# Patient Record
Sex: Female | Born: 1950 | Race: Black or African American | Hispanic: No | Marital: Married | Smoking: Never smoker
Health system: Southern US, Community
[De-identification: ages and names within clinical notes are randomized; demographics above are authoritative.]

## PROBLEM LIST (undated history)

## (undated) DIAGNOSIS — E119 Type 2 diabetes mellitus without complications: Secondary | ICD-10-CM

## (undated) DIAGNOSIS — I10 Essential (primary) hypertension: Secondary | ICD-10-CM

## (undated) HISTORY — PX: ABDOMINAL HYSTERECTOMY: SHX81

## (undated) HISTORY — PX: BILATERAL OOPHORECTOMY: SHX1221

---

## 2001-07-15 ENCOUNTER — Other Ambulatory Visit: Admission: RE | Admit: 2001-07-15 | Discharge: 2001-07-15 | Payer: Self-pay | Admitting: Obstetrics and Gynecology

## 2001-10-09 ENCOUNTER — Encounter: Payer: Self-pay | Admitting: Cardiology

## 2001-10-09 ENCOUNTER — Ambulatory Visit (HOSPITAL_COMMUNITY): Admission: RE | Admit: 2001-10-09 | Discharge: 2001-10-09 | Payer: Self-pay | Admitting: Cardiology

## 2002-07-27 ENCOUNTER — Ambulatory Visit (HOSPITAL_COMMUNITY): Admission: RE | Admit: 2002-07-27 | Discharge: 2002-07-27 | Payer: Self-pay | Admitting: Family Medicine

## 2004-03-12 ENCOUNTER — Ambulatory Visit (HOSPITAL_COMMUNITY): Admission: RE | Admit: 2004-03-12 | Discharge: 2004-03-12 | Payer: Self-pay | Admitting: Family Medicine

## 2004-03-13 ENCOUNTER — Ambulatory Visit (HOSPITAL_COMMUNITY): Admission: RE | Admit: 2004-03-13 | Discharge: 2004-03-13 | Payer: Self-pay | Admitting: Family Medicine

## 2004-05-04 ENCOUNTER — Ambulatory Visit (HOSPITAL_COMMUNITY): Admission: RE | Admit: 2004-05-04 | Discharge: 2004-05-04 | Payer: Self-pay | Admitting: Family Medicine

## 2004-07-30 ENCOUNTER — Ambulatory Visit (HOSPITAL_COMMUNITY): Admission: RE | Admit: 2004-07-30 | Discharge: 2004-07-30 | Payer: Self-pay | Admitting: Cardiology

## 2004-07-31 ENCOUNTER — Ambulatory Visit: Payer: Self-pay | Admitting: Cardiology

## 2005-08-09 ENCOUNTER — Ambulatory Visit (HOSPITAL_COMMUNITY): Admission: RE | Admit: 2005-08-09 | Discharge: 2005-08-09 | Payer: Self-pay | Admitting: Family Medicine

## 2010-07-11 ENCOUNTER — Encounter: Admission: RE | Admit: 2010-07-11 | Discharge: 2010-07-11 | Payer: Self-pay | Admitting: Family Medicine

## 2012-07-01 ENCOUNTER — Ambulatory Visit
Admission: RE | Admit: 2012-07-01 | Discharge: 2012-07-01 | Disposition: A | Discharge status: Home / Self Care | Payer: Managed Care, Other (non HMO) | Source: Ambulatory Visit | Attending: Family Medicine | Admitting: Family Medicine

## 2012-07-01 ENCOUNTER — Other Ambulatory Visit: Payer: Self-pay | Admitting: Family Medicine

## 2012-07-01 ENCOUNTER — Ambulatory Visit: Payer: Self-pay

## 2012-07-01 DIAGNOSIS — G8929 Other chronic pain: Secondary | ICD-10-CM

## 2013-04-17 ENCOUNTER — Encounter (HOSPITAL_COMMUNITY): Payer: Self-pay | Admitting: Emergency Medicine

## 2013-04-17 ENCOUNTER — Emergency Department (HOSPITAL_COMMUNITY): Payer: BC Managed Care – PPO

## 2013-04-17 ENCOUNTER — Emergency Department (HOSPITAL_COMMUNITY)
Admission: EM | Admit: 2013-04-17 | Discharge: 2013-04-18 | Disposition: A | Discharge status: Home / Self Care | Payer: BC Managed Care – PPO | Attending: Emergency Medicine | Admitting: Emergency Medicine

## 2013-04-17 DIAGNOSIS — M25461 Effusion, right knee: Secondary | ICD-10-CM

## 2013-04-17 DIAGNOSIS — S8000XA Contusion of unspecified knee, initial encounter: Secondary | ICD-10-CM | POA: Insufficient documentation

## 2013-04-17 DIAGNOSIS — W010XXA Fall on same level from slipping, tripping and stumbling without subsequent striking against object, initial encounter: Secondary | ICD-10-CM | POA: Insufficient documentation

## 2013-04-17 DIAGNOSIS — Y9301 Activity, walking, marching and hiking: Secondary | ICD-10-CM | POA: Insufficient documentation

## 2013-04-17 DIAGNOSIS — Y9289 Other specified places as the place of occurrence of the external cause: Secondary | ICD-10-CM | POA: Insufficient documentation

## 2013-04-17 DIAGNOSIS — S8011XA Contusion of right lower leg, initial encounter: Secondary | ICD-10-CM

## 2013-04-17 HISTORY — DX: Essential (primary) hypertension: I10

## 2013-04-17 HISTORY — DX: Type 2 diabetes mellitus without complications: E11.9

## 2013-04-17 MED ORDER — OXYCODONE-ACETAMINOPHEN 5-325 MG PO TABS
1.0000 | ORAL_TABLET | Freq: Once | ORAL | Status: AC
  Administered 2013-04-17: 1 via ORAL
  Filled 2013-04-17: qty 1

## 2013-04-17 NOTE — ED Notes (Signed)
Per pt, fell in garage around 2100. Pt's husband helped pt wrap right knee with ace wrap post fall. Pt presents to the ED with complaint of right thigh, right knee, right calf, left index finger, and left palm pain. Pt states leg is swollen. Upon assessment, pt's is tender to touch on right thigh and knee.

## 2013-04-17 NOTE — ED Notes (Signed)
MD at bedside. 

## 2013-04-17 NOTE — ED Provider Notes (Signed)
CSN: [id number]     Arrival date & time 04/17/13  2258 History  This chart was scribed for Dione Booze, MD, by Yevette Edwards, ED Scribe. This patient was seen in room APA14/[facility]14 and the patient's care was started at 11:08 PM.   First MD Initiated Contact with Patient 04/17/13 2307     Chief Complaint  Patient presents with  . Fall  . Leg Pain    HPI HPI Comments: Madison Arias is a 62 y.o. female who presents to the Emergency Department complaining of a fall which occurred today when she slipped in her garage. She is experiencing pain to her right thigh, her right knee, and her right calf; she is also experiencing swelling to the affected sites. She states that she had difficulty standing after the fall due to the pain to her knee, and she rates the pain as 10/10. She reports that bending the knee increases the pain; she used ace-wrap to help stabilize the pain. The pt reports that she is also experiencing pain to her interscapular back down through her lower back. She denies hitting her head or an LOC. She takes an ASA daily. She denies smoking or using alcohol.   No past medical history on file. No past surgical history on file. No family history on file. History  Substance Use Topics  . Smoking status: Not on file  . Smokeless tobacco: Not on file  . Alcohol Use: Not on file   OB History   No data available     Review of Systems  HENT: Negative for neck pain.   Musculoskeletal: Positive for back pain and arthralgias.  Neurological: Negative for syncope.  All other systems reviewed and are negative.    Allergies  Review of patient's allergies indicates not on file.  Home Medications  No current outpatient prescriptions on file.  Triage Vitals: BP 151/75  Pulse 99  Temp(Src) 99 F (37.2 C) (Oral)  Resp 24  Ht 5\' 8"  (1.727 m)  Wt 205 lb (92.987 kg)  BMI 31.18 kg/m2  SpO2 100%  Physical Exam  Nursing note and vitals reviewed. Constitutional: She is oriented to  person, place, and time. She appears well-developed and well-nourished. No distress.  HENT:  Head: Normocephalic and atraumatic.  Eyes: EOM are normal.  Neck: Neck supple. No tracheal deviation present.  Cardiovascular: Normal rate.   Pulmonary/Chest: Effort normal. No respiratory distress.  Musculoskeletal: Normal range of motion. She exhibits tenderness.  Mild tenderness of thoracic and lumbar spine. No point tenderness. Moderate effusion of right knee with tenderness anterioraly.  Moderate swelling of right calf with tenderness diffusely.  Neuro intact.   Neurological: She is alert and oriented to person, place, and time.  Skin: Skin is warm and dry.  Psychiatric: She has a normal mood and affect. Her behavior is normal.    ED Course  Procedures (including critical care time)   DIAGNOSTIC STUDIES:  Oxygen Saturation is 100% on room air, normal by my interpretation.    COORDINATION OF CARE:  11:15 PM- Discussed treatment plan with patient, and the patient agreed to the plan.   Imaging Review Dg Thoracic Spine W/swimmers  04/18/2013   *RADIOLOGY REPORT*  Clinical Data: Fall with pain.  THORACIC SPINE - 2 VIEW + SWIMMERS  Comparison: None.  Findings: No evidence of acute fracture or subluxation. Diffuse degenerative endplate spurring.  Numerous bilateral calcified pulmonary nodules, compatible with remote granulomatous infection.  No evidence of acute injury to the chest.  IMPRESSION:  No evidence of acute thoracic spine injury.   Original Report Authenticated By: Tiburcio Pea   Dg Lumbar Spine Complete  04/18/2013   CLINICAL DATA:  Fall.  EXAM: LUMBAR SPINE - COMPLETE 4+ VIEW  COMPARISON:  None  FINDINGS: Degenerative spurring throughout the lumbar spine. No fracture. No malalignment. SI joints are symmetric and unremarkable.  IMPRESSION: Degenerative spurring. No acute findings.   Electronically Signed   By: Charlett Nose   On: 04/18/2013 01:55   Dg Femur Right  04/18/2013    *RADIOLOGY REPORT*  Clinical Data: Fall with pain.  RIGHT FEMUR - 2 VIEW  Comparison: None.  Findings: Negative for fracture. Mild hip osteoarthritis with marginal spurring.  Myotendinous junction distal quadriceps heterotopic ossification.  IMPRESSION: Negative for acute osseous injury.   Original Report Authenticated By: Tiburcio Pea   Dg Tibia/fibula Right  04/18/2013   CLINICAL DATA:  Fall, pain.  EXAM: RIGHT TIBIA AND FIBULA - 2 VIEW  COMPARISON:  None  FINDINGS: No acute bony abnormality. Specifically, no fracture, subluxation, or dislocation. Soft tissues are intact.  IMPRESSION: Negative.   Electronically Signed   By: Charlett Nose   On: 04/18/2013 01:57   Ct Cervical Spine Wo Contrast  04/18/2013   CLINICAL DATA:  Fall. Right neck pain.  EXAM: CT CERVICAL SPINE WITHOUT CONTRAST  TECHNIQUE: Multidetector CT imaging of the cervical spine was performed without intravenous contrast. Multiplanar CT image reconstructions were also generated.  COMPARISON:  None.  FINDINGS: Diffuse degenerative disc disease with large anterior and lateral osteophytes. Normal alignment. Prevertebral soft tissues are normal. No fracture. No epidural or paraspinal hematoma.  IMPRESSION: Severe degenerative changes. No acute findings.   Electronically Signed   By: Charlett Nose   On: 04/18/2013 00:29   Dg Knee Complete 4 Views Right  04/18/2013   CLINICAL DATA:  Fall, pain.  EXAM: RIGHT KNEE - COMPLETE 4+ VIEW  COMPARISON:  Femur series performed today. These include the AP and lateral views of the knee.  FINDINGS: No acute bony abnormality. Specifically, no fracture, subluxation, or dislocation. Soft tissues are intact. No joint effusion.  IMPRESSION: No acute bony abnormality.   Electronically Signed   By: Charlett Nose   On: 04/18/2013 01:57   Images viewed by me.   MDM   1. Fall from slipping, initial encounter   2. Contusion of right lower leg, initial encounter   3. Joint effusion, knee, right    Left main  injury to the right knee and right calf. She'll be sent for x-rays including spine, femur, tib-fib. She's given oxycodone-acetaminophen for pain.  X-rays are negative for acute bony injury. She's given a knee immobilizer and prescriptions given for oxycodone and acetaminophen for pain control. She is referred to Dr. Romeo Apple, on call for orthopedics, for followup.   I personally performed the services described in this documentation, which was scribed in my presence. The recorded information has been reviewed and is accurate.     Dione Booze, MD 04/18/13 [phone]

## 2013-04-17 NOTE — ED Notes (Signed)
Patient states she walking into her garage and fell on concrete; states she feels like her right leg went behind her.  Also c/o left hand pain and lower back pain.

## 2013-04-18 MED ORDER — OXYCODONE-ACETAMINOPHEN 5-325 MG PO TABS: 1.0000 | ORAL_TABLET | ORAL | Status: DC | PRN

## 2013-04-18 MED ORDER — OXYCODONE-ACETAMINOPHEN 5-325 MG PO TABS
1.0000 | ORAL_TABLET | Freq: Once | ORAL | Status: AC
  Administered 2013-04-18: 1 via ORAL
  Filled 2013-04-18: qty 1

## 2013-04-21 ENCOUNTER — Telehealth: Payer: Self-pay | Admitting: *Deleted

## 2013-04-21 ENCOUNTER — Encounter: Payer: Self-pay | Admitting: Orthopedic Surgery

## 2013-04-21 ENCOUNTER — Ambulatory Visit (INDEPENDENT_AMBULATORY_CARE_PROVIDER_SITE_OTHER): Payer: BC Managed Care – PPO | Admitting: Orthopedic Surgery

## 2013-04-21 VITALS — BP 173/100 | Ht 68.0 in | Wt 205.0 lb

## 2013-04-21 DIAGNOSIS — S76111A Strain of right quadriceps muscle, fascia and tendon, initial encounter: Secondary | ICD-10-CM

## 2013-04-21 DIAGNOSIS — IMO0002 Reserved for concepts with insufficient information to code with codable children: Secondary | ICD-10-CM

## 2013-04-21 DIAGNOSIS — S838X9A Sprain of other specified parts of unspecified knee, initial encounter: Secondary | ICD-10-CM

## 2013-04-21 DIAGNOSIS — T148XXA Other injury of unspecified body region, initial encounter: Secondary | ICD-10-CM

## 2013-04-21 DIAGNOSIS — S76119A Strain of unspecified quadriceps muscle, fascia and tendon, initial encounter: Secondary | ICD-10-CM | POA: Insufficient documentation

## 2013-04-21 NOTE — Telephone Encounter (Signed)
MRI pre-cert # [id number] valid for 30 days from 04/21/13 per Eunice Blase F MRI scheduled for 04/22/13 at 8 pm, Patient to arrive at 7:45 Patient is aware of appointment date and time, Dr. Romeo Apple to call patient with results.

## 2013-04-21 NOTE — Patient Instructions (Signed)
MRI ORDERED/ DR H NEEDS IT DONE TODAY OOW NOTE 04/17/13 - 04/28/13

## 2013-04-22 ENCOUNTER — Encounter: Payer: Self-pay | Admitting: Orthopedic Surgery

## 2013-04-22 ENCOUNTER — Ambulatory Visit (HOSPITAL_COMMUNITY)
Admission: RE | Admit: 2013-04-22 | Discharge: 2013-04-22 | Disposition: A | Discharge status: Home / Self Care | Payer: BC Managed Care – PPO | Source: Ambulatory Visit | Attending: Orthopedic Surgery | Admitting: Orthopedic Surgery

## 2013-04-22 DIAGNOSIS — M7120 Synovial cyst of popliteal space [Baker], unspecified knee: Secondary | ICD-10-CM | POA: Insufficient documentation

## 2013-04-22 DIAGNOSIS — IMO0002 Reserved for concepts with insufficient information to code with codable children: Secondary | ICD-10-CM | POA: Insufficient documentation

## 2013-04-22 DIAGNOSIS — M2419 Other articular cartilage disorders, other specified site: Secondary | ICD-10-CM | POA: Insufficient documentation

## 2013-04-22 DIAGNOSIS — T148XXA Other injury of unspecified body region, initial encounter: Secondary | ICD-10-CM

## 2013-04-22 DIAGNOSIS — M249 Joint derangement, unspecified: Secondary | ICD-10-CM | POA: Insufficient documentation

## 2013-04-22 DIAGNOSIS — M6740 Ganglion, unspecified site: Secondary | ICD-10-CM | POA: Insufficient documentation

## 2013-04-22 DIAGNOSIS — M1710 Unilateral primary osteoarthritis, unspecified knee: Secondary | ICD-10-CM | POA: Insufficient documentation

## 2013-04-22 DIAGNOSIS — M2240 Chondromalacia patellae, unspecified knee: Secondary | ICD-10-CM | POA: Insufficient documentation

## 2013-04-22 NOTE — Progress Notes (Signed)
Patient ID: Madison Arias, female   DOB: August 06, 1951, 62 y.o.   MRN: [id number]  Chief Complaint  Patient presents with  . Leg Pain    Right leg pain goes all the way down into foot   HISTORY:  62 year old female diabetic with hypertension status post hysterectomy bilateral oophorectomy and D&C fell on Saturday, August 30 at her home going out the door leading to the garage. She presents now with intense pain in her right knee just above the patella with a palpable defect although she has intact extension of the knee. She did not tolerate Percocet. She was placed in a knee immobilizer at the emergency room and she is ambulating with a knee immobilizer crutches weightbearing as tolerated. She is a significant amount of bruising and swelling of the right leg and knee joint with throbbing burning constant pain.  The review of systems was completed and revealed that the patient has had weight gain heart palpitations dizziness and seasonal allergies  Medications include aspirin lovastatin metformin janivia  hydrochlorothiazide and levimiv and she did not bring the medicine so we do not know the dosing  BP 173/100  Ht 5\' 8"  (1.727 m)  Wt 205 lb (92.987 kg)  BMI 31.18 kg/m2  General appearance reveals normal development nutrition body habitus without deformity and good grooming Peripheral vascular system no swelling or varicose veins Pulses and temperature are intact without edema or tenderness  Cervical axillary and inguinal lymph nodes are normal  Gait and station are protected with a brace and crutches  The upper extremities are normally aligned without contracture crepitation subluxation dislocation atrophy tremor or spasticity  The left lower extremity reveals full range of motion normal strength stability alignment.  The right knee reveals 95 of knee flexion the patient has intact straight leg raise but a palpable defect at the superior pole of patella and the tendon is palpable  separated from the patella with large joint effusion. The knee remains stable there is weakness in extension  Her skin is normal x4  We cannot test lower extremity coordination or lower extremity reflexes otherwise reflexes were normal sensation was intact she was oriented x3 she had a normal mood and affect  Imaging Review Dg Thoracic Spine W/swimmers  04/18/2013   *RADIOLOGY REPORT*  Clinical Data: Fall with pain.  THORACIC SPINE - 2 VIEW + SWIMMERS  Comparison: None.  Findings: No evidence of acute fracture or subluxation. Diffuse degenerative endplate spurring.  Numerous bilateral calcified pulmonary nodules, compatible with remote granulomatous infection.  No evidence of acute injury to the chest.  IMPRESSION: No evidence of acute thoracic spine injury.   Original Report Authenticated By: Tiburcio Pea   Dg Lumbar Spine Complete  04/18/2013   CLINICAL DATA:  Fall.  EXAM: LUMBAR SPINE - COMPLETE 4+ VIEW  COMPARISON:  None  FINDINGS: Degenerative spurring throughout the lumbar spine. No fracture. No malalignment. SI joints are symmetric and unremarkable.  IMPRESSION: Degenerative spurring. No acute findings.   Electronically Signed   By: Charlett Nose   On: 04/18/2013 01:55   Dg Femur Right  04/18/2013   *RADIOLOGY REPORT*  Clinical Data: Fall with pain.  RIGHT FEMUR - 2 VIEW  Comparison: None.  Findings: Negative for fracture. Mild hip osteoarthritis with marginal spurring.  Myotendinous junction distal quadriceps heterotopic ossification.  IMPRESSION: Negative for acute osseous injury.   Original Report Authenticated By: Tiburcio Pea   Dg Tibia/fibula Right  04/18/2013   CLINICAL DATA:  Fall, pain.  EXAM: RIGHT  TIBIA AND FIBULA - 2 VIEW  COMPARISON:  None  FINDINGS: No acute bony abnormality. Specifically, no fracture, subluxation, or dislocation. Soft tissues are intact.  IMPRESSION: Negative.   Electronically Signed   By: Charlett Nose   On: 04/18/2013 01:57   Ct Cervical Spine Wo  Contrast  04/18/2013   CLINICAL DATA:  Fall. Right neck pain.  EXAM: CT CERVICAL SPINE WITHOUT CONTRAST  TECHNIQUE: Multidetector CT imaging of the cervical spine was performed without intravenous contrast. Multiplanar CT image reconstructions were also generated.  COMPARISON:  None.  FINDINGS: Diffuse degenerative disc disease with large anterior and lateral osteophytes. Normal alignment. Prevertebral soft tissues are normal. No fracture. No epidural or paraspinal hematoma.  IMPRESSION: Severe degenerative changes. No acute findings.   Electronically Signed   By: Charlett Nose   On: 04/18/2013 00:29   Dg Knee Complete 4 Views Right  04/18/2013   CLINICAL DATA:  Fall, pain.  EXAM: RIGHT KNEE - COMPLETE 4+ VIEW  COMPARISON:  Femur series performed today. These include the AP and lateral views of the knee.  FINDINGS: No acute bony abnormality. Specifically, no fracture, subluxation, or dislocation. Soft tissues are intact. No joint effusion.  IMPRESSION: No acute bony abnormality.   Electronically Signed   By: Charlett Nose   On: 04/18/2013 01:57    The x-rays were read as normal but she does have a low riding patella and a fragment of bone full from the superior pole patella note noted in the soft tissues of the distal thigh consistent with a quadriceps rupture MRI will be performed to evaluate the need for surgical treatment of the ruptured tendon   Encounter Diagnoses  Name Primary?  . Tendon tear Yes  . Quadriceps muscle rupture, right, initial encounter   . Rupture quadriceps tendon, right, initial encounter     MRI right knee to evaluate for surgical repair of the quadriceps tendon

## 2013-04-26 ENCOUNTER — Telehealth: Payer: Self-pay | Admitting: Orthopedic Surgery

## 2013-04-26 MED FILL — Oxycodone w/ Acetaminophen Tab 5-325 MG: ORAL | Qty: 6 | Status: AC

## 2013-04-26 NOTE — Telephone Encounter (Signed)
Routing to Dr Harrison 

## 2013-04-26 NOTE — Telephone Encounter (Signed)
Call her tell her she has Tear quadriceps tendon  Will need surgery and i will call her to discuss the surgery  later in the day

## 2013-04-26 NOTE — Telephone Encounter (Signed)
Patient called to ask about MRI results of right knee which she had on Friday, 9/4/104 and plan.  Her cell ph # is [phone].

## 2013-04-26 NOTE — Telephone Encounter (Signed)
Message relayed to patient. She will be awaiting Dr. Mort Sawyers call.

## 2013-04-27 ENCOUNTER — Other Ambulatory Visit: Payer: Self-pay | Admitting: Orthopedic Surgery

## 2013-04-27 ENCOUNTER — Telehealth: Payer: Self-pay | Admitting: Orthopedic Surgery

## 2013-04-27 ENCOUNTER — Encounter: Payer: Self-pay | Admitting: Orthopedic Surgery

## 2013-04-27 NOTE — Telephone Encounter (Signed)
Patient called back, states awaiting further information and a call back from Dr. Romeo Apple regarding plan of treatment / surgery.  Best call back # is cell#[phone].

## 2013-04-27 NOTE — Telephone Encounter (Signed)
Called patient to discuss surgery.

## 2013-04-27 NOTE — Telephone Encounter (Signed)
Dr. Romeo Apple called patient regarding surgery. The surgery was scheduled and all dates and times were given to patient.

## 2013-04-28 ENCOUNTER — Telehealth: Payer: Self-pay | Admitting: Orthopedic Surgery

## 2013-04-28 ENCOUNTER — Encounter (HOSPITAL_COMMUNITY): Payer: Self-pay | Admitting: Pharmacy Technician

## 2013-04-28 ENCOUNTER — Encounter: Payer: Self-pay | Admitting: Orthopedic Surgery

## 2013-04-28 NOTE — Telephone Encounter (Signed)
Regarding surgery scheduled 05/03/13 at [facility], CPT [phone], ICD9 codes 848.9, 843.9, 844.8, contacted insurer [employer], ph# [phone]; per Leota Sauers, no pre-authorization required for out-patient or for up to 48 hours observation stay. Her name and today's date for reference, 04/28/13, 2:22 p.m.

## 2013-04-29 ENCOUNTER — Encounter (HOSPITAL_COMMUNITY): Payer: Self-pay | Admitting: Pharmacy Technician

## 2013-04-29 ENCOUNTER — Encounter (HOSPITAL_COMMUNITY)
Admission: RE | Admit: 2013-04-29 | Discharge: 2013-04-29 | Disposition: A | Discharge status: Home / Self Care | Payer: BC Managed Care – PPO | Source: Ambulatory Visit | Attending: Orthopedic Surgery | Admitting: Orthopedic Surgery

## 2013-04-29 ENCOUNTER — Other Ambulatory Visit: Payer: Self-pay

## 2013-04-29 ENCOUNTER — Encounter (HOSPITAL_COMMUNITY): Payer: Self-pay

## 2013-04-29 DIAGNOSIS — Z01810 Encounter for preprocedural cardiovascular examination: Secondary | ICD-10-CM | POA: Insufficient documentation

## 2013-04-29 DIAGNOSIS — Z01818 Encounter for other preprocedural examination: Secondary | ICD-10-CM | POA: Insufficient documentation

## 2013-04-29 DIAGNOSIS — Z01812 Encounter for preprocedural laboratory examination: Secondary | ICD-10-CM | POA: Insufficient documentation

## 2013-04-29 LAB — CBC
Hemoglobin: 12.4 g/dL (ref 12.0–15.0)
MCH: 28.8 pg (ref 26.0–34.0)
RBC: 4.31 MIL/uL (ref 3.87–5.11)

## 2013-04-29 LAB — BASIC METABOLIC PANEL
CO2: 30 mEq/L (ref 19–32)
Chloride: 104 mEq/L (ref 96–112)
Glucose, Bld: 126 mg/dL — ABNORMAL HIGH (ref 70–99)
Potassium: 4 mEq/L (ref 3.5–5.1)
Sodium: 143 mEq/L (ref 135–145)

## 2013-04-29 NOTE — Patient Instructions (Addendum)
   Your procedure is scheduled on: 05/03/2013  Report to [facility] at   8:30  AM.  Call this number if you have problems the morning of surgery: [phone]   Remember:   Do not drink or eat food:After Midnight.  :  Take these medicines the morning of surgery with A SIP OF WATER: none   Do not wear jewelry, make-up or nail polish.  Do not wear lotions, powders, or perfumes. You may wear deodorant.  Do not shave 48 hours prior to surgery. Men may shave face and neck.  Do not bring valuables to the hospital.  Contacts, dentures or bridgework may not be worn into surgery.  Leave suitcase in the car. After surgery it may be brought to your room.  For patients admitted to the hospital, checkout time is 11:00 AM the day of discharge.   Patients discharged the day of surgery will not be allowed to drive home.    Special Instructions: Shower using CHG 2 nights before surgery and the night before surgery.  If you shower the day of surgery use CHG.  Use special wash - you have one bottle of CHG for all showers.  You should use approximately 1/3 of the bottle for each shower.   Please read over the following fact sheets that you were given: Pain Booklet, MRSA Information, Surgical Site Infection Prevention and Care and Recovery After Surgery  PATIENT INSTRUCTIONS POST-ANESTHESIA  IMMEDIATELY FOLLOWING SURGERY:  Do not drive or operate machinery for the first twenty four hours after surgery.  Do not make any important decisions for twenty four hours after surgery or while taking narcotic pain medications or sedatives.  If you develop intractable nausea and vomiting or a severe headache please notify your doctor immediately.  FOLLOW-UP:  Please make an appointment with your surgeon as instructed. You do not need to follow up with anesthesia unless specifically instructed to do so.  WOUND CARE INSTRUCTIONS (if applicable):  Keep a dry clean dressing on the anesthesia/puncture wound site if there is  drainage.  Once the wound has quit draining you may leave it open to air.  Generally you should leave the bandage intact for twenty four hours unless there is drainage.  If the epidural site drains for more than 36-48 hours please call the anesthesia department.  QUESTIONS?:  Please feel free to call your physician or the hospital operator if you have any questions, and they will be happy to assist you.

## 2013-04-30 NOTE — H&P (Signed)
Chief Complaint   Patient presents with   .  Leg Pain       Right leg pain goes all the way down into foot    HISTORY:  62 year old female diabetic with hypertension status post hysterectomy bilateral oophorectomy and D&C fell on Saturday, August 30 at her home going out the door leading to the garage. She presents now with intense pain in her right knee just above the patella with a palpable defect although she has intact extension of the knee. She did not tolerate Percocet. She was placed in a knee immobilizer at the emergency room and she is ambulating with a knee immobilizer crutches weightbearing as tolerated. She is a significant amount of bruising and swelling of the right leg and knee joint with throbbing burning constant pain.  Past Medical History  Diagnosis Date  . Hypertension   . Diabetes mellitus without complication     Past Surgical History  Procedure Laterality Date  . Bilateral oophorectomy    . Abdominal hysterectomy      Family History  Problem Relation Age of Onset  . Diabetes     History  Substance Use Topics  . Smoking status: Never Smoker   . Smokeless tobacco: Not on file  . Alcohol Use: No     The review of systems was completed and revealed that the patient has had weight gain heart palpitations dizziness and seasonal allergies  Medications include aspirin lovastatin metformin janivia  hydrochlorothiazide and levimiv and she did not bring the medicine so we do not know the dosing  BP 173/100  Ht 5\' 8"  (1.727 m)  Wt 205 lb (92.987 kg)  BMI 31.18 kg/m2  General appearance reveals normal development nutrition body habitus without deformity and good grooming Peripheral vascular system no swelling or varicose veins Pulses and temperature are intact without edema or tenderness  Cervical axillary and inguinal lymph nodes are normal  Gait and station are protected with a brace and crutches  The upper extremities are normally aligned without  contracture crepitation subluxation dislocation atrophy tremor or spasticity  The left lower extremity reveals full range of motion normal strength stability alignment.  The right knee reveals 95 of knee flexion the patient has intact straight leg raise but a palpable defect at the superior pole of patella and the tendon is palpable separated from the patella with large joint effusion. The knee remains stable there is weakness in extension  Her skin is normal x4  We cannot test lower extremity coordination or lower extremity reflexes otherwise reflexes were normal sensation was intact she was oriented x3 she had a normal mood and affect 04/18/2013   CLINICAL DATA:  Fall. Right neck pain.  EXAM: CT CERVICAL SPINE WITHOUT CONTRAST  TECHNIQUE: Multidetector CT imaging of the cervical spine was performed without intravenous contrast. Multiplanar CT image reconstructions were also generated.  COMPARISON:  None.  FINDINGS: Diffuse degenerative disc disease with large anterior and lateral osteophytes. Normal alignment. Prevertebral soft tissues are normal. No fracture. No epidural or paraspinal hematoma.  IMPRESSION: Severe degenerative changes. No acute findings.   Electronically Signed   By: Charlett Nose   On: 04/18/2013 00:29   Dg Knee Complete 4 Views Right  04/18/2013   CLINICAL DATA:  Fall, pain.  EXAM: RIGHT KNEE - COMPLETE 4+ VIEW  COMPARISON:  Femur series performed today. These include the AP and lateral views of the knee.  FINDINGS: No acute bony abnormality. Specifically, no fracture, subluxation, or dislocation. Soft tissues  are intact. No joint effusion.  IMPRESSION: No acute bony abnormality.   Electronically Signed   By: Charlett Nose   On: 04/18/2013 01:57    The x-rays were read as normal but she does have a low riding patella and a fragment of bone full from the superior pole patella note noted in the soft tissues of the distal thigh consistent with a quadriceps rupture MRI will be performed  to evaluate the need for surgical treatment of the ruptured tendon     Encounter Diagnoses   Name  Primary?   .  Tendon tear  Yes   .  Quadriceps muscle rupture, right, initial encounter     .  Rupture quadriceps tendon, right, initial encounter      MRI findings IMPRESSION: 1.  Incomplete rupture of the distal quadriceps tendon with 15 mm retraction.  The tear penetrates the superficial three-quarters of the tendon with a small portion of the deep tendon intact. 2.  Fraying of the menisci without tear. 3.  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 4.  Proximal tibiofibular joint osteoarthritis with ganglion extending anteriorly to the joint and inferiorly which could encroach on the common peroneal nerve.  Quadriceps tendon rupture right knee  Plan for surgical repair quadriceps tendon right knee

## 2013-05-03 ENCOUNTER — Ambulatory Visit (HOSPITAL_COMMUNITY): Payer: BC Managed Care – PPO | Admitting: Anesthesiology

## 2013-05-03 ENCOUNTER — Encounter (HOSPITAL_COMMUNITY): Payer: Self-pay | Admitting: *Deleted

## 2013-05-03 ENCOUNTER — Encounter (HOSPITAL_COMMUNITY): Payer: Self-pay | Admitting: Anesthesiology

## 2013-05-03 ENCOUNTER — Encounter (HOSPITAL_COMMUNITY)
Admission: RE | Disposition: A | Discharge status: Home / Self Care | Payer: Self-pay | Source: Ambulatory Visit | Attending: Orthopedic Surgery

## 2013-05-03 ENCOUNTER — Ambulatory Visit (HOSPITAL_COMMUNITY)
Admission: RE | Admit: 2013-05-03 | Discharge: 2013-05-03 | Disposition: A | Discharge status: Home / Self Care | Payer: BC Managed Care – PPO | Source: Ambulatory Visit | Attending: Orthopedic Surgery | Admitting: Orthopedic Surgery

## 2013-05-03 DIAGNOSIS — Z5189 Encounter for other specified aftercare: Secondary | ICD-10-CM

## 2013-05-03 DIAGNOSIS — S838X9A Sprain of other specified parts of unspecified knee, initial encounter: Secondary | ICD-10-CM | POA: Insufficient documentation

## 2013-05-03 DIAGNOSIS — IMO0002 Reserved for concepts with insufficient information to code with codable children: Secondary | ICD-10-CM

## 2013-05-03 DIAGNOSIS — Z01812 Encounter for preprocedural laboratory examination: Secondary | ICD-10-CM | POA: Insufficient documentation

## 2013-05-03 DIAGNOSIS — S76111D Strain of right quadriceps muscle, fascia and tendon, subsequent encounter: Secondary | ICD-10-CM

## 2013-05-03 DIAGNOSIS — E119 Type 2 diabetes mellitus without complications: Secondary | ICD-10-CM | POA: Insufficient documentation

## 2013-05-03 DIAGNOSIS — Y92009 Unspecified place in unspecified non-institutional (private) residence as the place of occurrence of the external cause: Secondary | ICD-10-CM | POA: Insufficient documentation

## 2013-05-03 DIAGNOSIS — W19XXXA Unspecified fall, initial encounter: Secondary | ICD-10-CM | POA: Insufficient documentation

## 2013-05-03 HISTORY — PX: QUADRICEPS TENDON REPAIR: SHX756

## 2013-05-03 LAB — GLUCOSE, CAPILLARY: Glucose-Capillary: 100 mg/dL — ABNORMAL HIGH (ref 70–99)

## 2013-05-03 SURGERY — REPAIR, TENDON, QUADRICEPS
Anesthesia: General | Site: Knee | Laterality: Right | Wound class: Clean

## 2013-05-03 MED ORDER — MIDAZOLAM HCL 2 MG/2ML IJ SOLN
INTRAMUSCULAR | Status: AC
  Filled 2013-05-03: qty 2

## 2013-05-03 MED ORDER — LACTATED RINGERS IV SOLN
INTRAVENOUS | Status: DC | PRN
  Administered 2013-05-03 (×2): via INTRAVENOUS

## 2013-05-03 MED ORDER — VANCOMYCIN HCL IN DEXTROSE 1-5 GM/200ML-% IV SOLN
1000.0000 mg | INTRAVENOUS | Status: AC
  Administered 2013-05-03: 1000 mg via INTRAVENOUS

## 2013-05-03 MED ORDER — FENTANYL CITRATE 0.05 MG/ML IJ SOLN
INTRAMUSCULAR | Status: AC
  Filled 2013-05-03: qty 2

## 2013-05-03 MED ORDER — BUPIVACAINE-EPINEPHRINE PF 0.5-1:200000 % IJ SOLN
INTRAMUSCULAR | Status: AC
  Filled 2013-05-03: qty 20

## 2013-05-03 MED ORDER — GLYCOPYRROLATE 0.2 MG/ML IJ SOLN
INTRAMUSCULAR | Status: AC
  Filled 2013-05-03: qty 1

## 2013-05-03 MED ORDER — BUPIVACAINE-EPINEPHRINE 0.5% -1:200000 IJ SOLN
INTRAMUSCULAR | Status: DC | PRN
  Administered 2013-05-03: 60 mL

## 2013-05-03 MED ORDER — MIDAZOLAM HCL 5 MG/5ML IJ SOLN
INTRAMUSCULAR | Status: DC | PRN
  Administered 2013-05-03: 2 mg via INTRAVENOUS

## 2013-05-03 MED ORDER — ONDANSETRON HCL 4 MG/2ML IJ SOLN
INTRAMUSCULAR | Status: AC
  Filled 2013-05-03: qty 2

## 2013-05-03 MED ORDER — CHLORHEXIDINE GLUCONATE 4 % EX LIQD: 60.0000 mL | Freq: Once | CUTANEOUS | Status: DC

## 2013-05-03 MED ORDER — LIDOCAINE HCL (PF) 1 % IJ SOLN
INTRAMUSCULAR | Status: AC
  Filled 2013-05-03: qty 5

## 2013-05-03 MED ORDER — ONDANSETRON HCL 4 MG/2ML IJ SOLN
4.0000 mg | Freq: Once | INTRAMUSCULAR | Status: AC
  Administered 2013-05-03: 4 mg via INTRAVENOUS

## 2013-05-03 MED ORDER — ONDANSETRON HCL 4 MG/2ML IJ SOLN: 4.0000 mg | Freq: Once | INTRAMUSCULAR | Status: DC | PRN

## 2013-05-03 MED ORDER — KETOROLAC TROMETHAMINE 30 MG/ML IJ SOLN
INTRAMUSCULAR | Status: AC
  Filled 2013-05-03: qty 1

## 2013-05-03 MED ORDER — FENTANYL CITRATE 0.05 MG/ML IJ SOLN
25.0000 ug | INTRAMUSCULAR | Status: DC | PRN
  Administered 2013-05-03: 50 ug via INTRAVENOUS
  Administered 2013-05-03: 25 ug via INTRAVENOUS
  Administered 2013-05-03: 50 ug via INTRAVENOUS
  Administered 2013-05-03: 25 ug via INTRAVENOUS
  Administered 2013-05-03: 50 ug via INTRAVENOUS

## 2013-05-03 MED ORDER — GLYCOPYRROLATE 0.2 MG/ML IJ SOLN
0.2000 mg | Freq: Once | INTRAMUSCULAR | Status: AC
  Administered 2013-05-03: 0.2 mg via INTRAVENOUS

## 2013-05-03 MED ORDER — KETOROLAC TROMETHAMINE 30 MG/ML IJ SOLN
30.0000 mg | Freq: Once | INTRAMUSCULAR | Status: AC
  Administered 2013-05-03: 30 mg via INTRAVENOUS

## 2013-05-03 MED ORDER — ROCURONIUM BROMIDE 100 MG/10ML IV SOLN
INTRAVENOUS | Status: DC | PRN
  Administered 2013-05-03: 10 mg via INTRAVENOUS

## 2013-05-03 MED ORDER — MIDAZOLAM HCL 2 MG/2ML IJ SOLN
1.0000 mg | INTRAMUSCULAR | Status: DC | PRN
  Administered 2013-05-03 (×2): 2 mg via INTRAVENOUS

## 2013-05-03 MED ORDER — OXYCODONE HCL 5 MG PO TABS
ORAL_TABLET | ORAL | Status: AC
  Filled 2013-05-03: qty 1

## 2013-05-03 MED ORDER — FENTANYL CITRATE 0.05 MG/ML IJ SOLN
INTRAMUSCULAR | Status: AC
  Filled 2013-05-03: qty 5

## 2013-05-03 MED ORDER — FENTANYL CITRATE 0.05 MG/ML IJ SOLN
50.0000 ug | INTRAMUSCULAR | Status: AC | PRN
  Administered 2013-05-03 (×2): 50 ug via INTRAVENOUS

## 2013-05-03 MED ORDER — LACTATED RINGERS IV SOLN
INTRAVENOUS | Status: DC
  Administered 2013-05-03: 1000 mL via INTRAVENOUS

## 2013-05-03 MED ORDER — SODIUM CHLORIDE 0.9 % IR SOLN
Status: DC | PRN
  Administered 2013-05-03: 1000 mL

## 2013-05-03 MED ORDER — ROCURONIUM BROMIDE 50 MG/5ML IV SOLN
INTRAVENOUS | Status: AC
  Filled 2013-05-03: qty 1

## 2013-05-03 MED ORDER — PROPOFOL 10 MG/ML IV EMUL
INTRAVENOUS | Status: AC
  Filled 2013-05-03: qty 20

## 2013-05-03 MED ORDER — SUCCINYLCHOLINE CHLORIDE 20 MG/ML IJ SOLN
INTRAMUSCULAR | Status: DC | PRN
  Administered 2013-05-03: 120 mg via INTRAVENOUS

## 2013-05-03 MED ORDER — DEXAMETHASONE SODIUM PHOSPHATE 4 MG/ML IJ SOLN
4.0000 mg | Freq: Once | INTRAMUSCULAR | Status: AC
  Administered 2013-05-03: 4 mg via INTRAVENOUS

## 2013-05-03 MED ORDER — DEXAMETHASONE SODIUM PHOSPHATE 4 MG/ML IJ SOLN
INTRAMUSCULAR | Status: AC
  Filled 2013-05-03: qty 1

## 2013-05-03 MED ORDER — OXYCODONE HCL 5 MG PO TABS
5.0000 mg | ORAL_TABLET | ORAL | Status: DC
  Administered 2013-05-03: 5 mg via ORAL

## 2013-05-03 MED ORDER — ARTIFICIAL TEARS OP OINT
TOPICAL_OINTMENT | OPHTHALMIC | Status: AC
  Filled 2013-05-03: qty 3.5

## 2013-05-03 MED ORDER — PROPOFOL 10 MG/ML IV BOLUS
INTRAVENOUS | Status: DC | PRN
  Administered 2013-05-03: 150 mg via INTRAVENOUS

## 2013-05-03 MED ORDER — OXYCODONE HCL 5 MG PO TABS
5.0000 mg | ORAL_TABLET | Freq: Once | ORAL | Status: AC
  Administered 2013-05-03: 5 mg via ORAL

## 2013-05-03 MED ORDER — LIDOCAINE HCL (CARDIAC) 20 MG/ML IV SOLN
INTRAVENOUS | Status: DC | PRN
  Administered 2013-05-03: 50 mg via INTRAVENOUS

## 2013-05-03 MED ORDER — OXYCODONE-ACETAMINOPHEN 5-325 MG PO TABS: 1.0000 | ORAL_TABLET | ORAL | Status: AC | PRN

## 2013-05-03 MED ORDER — FENTANYL CITRATE 0.05 MG/ML IJ SOLN
INTRAMUSCULAR | Status: DC | PRN
  Administered 2013-05-03: 50 ug via INTRAVENOUS
  Administered 2013-05-03: 100 ug via INTRAVENOUS
  Administered 2013-05-03 (×2): 50 ug via INTRAVENOUS
  Administered 2013-05-03: 100 ug via INTRAVENOUS
  Administered 2013-05-03 (×2): 50 ug via INTRAVENOUS

## 2013-05-03 MED ORDER — VANCOMYCIN HCL IN DEXTROSE 1-5 GM/200ML-% IV SOLN
INTRAVENOUS | Status: AC
  Filled 2013-05-03: qty 200

## 2013-05-03 SURGICAL SUPPLY — 48 items
BAG HAMPER (MISCELLANEOUS) ×2 IMPLANT
BANDAGE ESMARK 4X12 BL STRL LF (DISPOSABLE) ×1 IMPLANT
BIT DRILL 2.8X128 (BIT) ×2 IMPLANT
BLADE SURG SZ10 CARB STEEL (BLADE) ×2 IMPLANT
BNDG CMPR 12X4 ELC STRL LF (DISPOSABLE) ×1
BNDG COHESIVE 4X5 TAN NS LF (GAUZE/BANDAGES/DRESSINGS) ×2 IMPLANT
BNDG ESMARK 4X12 BLUE STRL LF (DISPOSABLE) ×2
BRACE T-SCOPE KNEE POSTOP (MISCELLANEOUS) ×1 IMPLANT
CHLORAPREP W/TINT 26ML (MISCELLANEOUS) ×2 IMPLANT
CLOTH BEACON ORANGE TIMEOUT ST (SAFETY) ×2 IMPLANT
COVER LIGHT HANDLE STERIS (MISCELLANEOUS) ×4 IMPLANT
CUFF TOURNIQUET SINGLE 24IN (TOURNIQUET CUFF) IMPLANT
CUFF TOURNIQUET SINGLE 34IN LL (TOURNIQUET CUFF) ×2 IMPLANT
DRSG MEPILEX BORDER 4X12 (GAUZE/BANDAGES/DRESSINGS) ×2 IMPLANT
GAUZE XEROFORM 5X9 LF (GAUZE/BANDAGES/DRESSINGS) IMPLANT
GLOVE ECLIPSE 6.5 STRL STRAW (GLOVE) ×1 IMPLANT
GLOVE EXAM NITRILE PF MED BLUE (GLOVE) ×1 IMPLANT
GLOVE INDICATOR 7.0 STRL GRN (GLOVE) ×1 IMPLANT
GLOVE SKINSENSE NS SZ8.0 LF (GLOVE) ×1
GLOVE SKINSENSE STRL SZ8.0 LF (GLOVE) ×1 IMPLANT
GLOVE SS BIOGEL STRL SZ 6.5 (GLOVE) IMPLANT
GLOVE SS N UNI LF 8.5 STRL (GLOVE) ×2 IMPLANT
GLOVE SUPERSENSE BIOGEL SZ 6.5 (GLOVE) ×1
GOWN STRL REIN XL XLG (GOWN DISPOSABLE) ×6 IMPLANT
IMMOBILIZER KNEE 19 UNV (ORTHOPEDIC SUPPLIES) ×1 IMPLANT
INST SET MAJOR BONE (KITS) ×2 IMPLANT
KIT ROOM TURNOVER APOR (KITS) ×2 IMPLANT
MANIFOLD NEPTUNE II (INSTRUMENTS) ×2 IMPLANT
NDL HYPO 21X1.5 SAFETY (NEEDLE) ×1 IMPLANT
NDL MAYO 6 CRC TAPER PT (NEEDLE) IMPLANT
NEEDLE HYPO 21X1.5 SAFETY (NEEDLE) ×2 IMPLANT
NEEDLE MAYO 6 CRC TAPER PT (NEEDLE) IMPLANT
NS IRRIG 1000ML POUR BTL (IV SOLUTION) ×2 IMPLANT
PACK BASIC LIMB (CUSTOM PROCEDURE TRAY) ×2 IMPLANT
PAD ARMBOARD 7.5X6 YLW CONV (MISCELLANEOUS) ×2 IMPLANT
PASSER SUT SWANSON 36MM LOOP (INSTRUMENTS) ×2 IMPLANT
SET BASIN LINEN APH (SET/KITS/TRAYS/PACK) ×2 IMPLANT
SPONGE LAP 18X18 X RAY DECT (DISPOSABLE) ×4 IMPLANT
STAPLER VISISTAT 35W (STAPLE) ×2 IMPLANT
SUT BRALON NAB BRD #1 30IN (SUTURE) ×4 IMPLANT
SUT ETHIBOND 5 LR DA (SUTURE) ×3 IMPLANT
SUT ETHIBOND NAB OS 4 #2 30IN (SUTURE) IMPLANT
SUT ETHILON 3 0 FSL (SUTURE) IMPLANT
SUT MON AB 0 CT1 (SUTURE) ×2 IMPLANT
SUT MON AB 2-0 CT1 36 (SUTURE) ×3 IMPLANT
SUT PROLENE 3 0 PS 1 (SUTURE) IMPLANT
SYR 30ML LL (SYRINGE) ×2 IMPLANT
SYR BULB IRRIGATION 50ML (SYRINGE) ×2 IMPLANT

## 2013-05-03 NOTE — Anesthesia Preprocedure Evaluation (Signed)
Anesthesia Evaluation  Patient identified by MRN, date of birth, ID band Patient awake    Reviewed: Allergy & Precautions, H&P , NPO status , Patient's Chart, lab work & pertinent test results  Airway Mallampati: III TM Distance: <3 FB Neck ROM: Full    Dental  (+) Teeth Intact   Pulmonary neg pulmonary ROS,  breath sounds clear to auscultation        Cardiovascular hypertension, Pt. on medications Rhythm:Regular Rate:Normal     Neuro/Psych    GI/Hepatic negative GI ROS,   Endo/Other  diabetes, Type 2, Oral Hypoglycemic Agents  Renal/GU      Musculoskeletal   Abdominal   Peds  Hematology   Anesthesia Other Findings   Reproductive/Obstetrics                           Anesthesia Physical Anesthesia Plan  ASA: III  Anesthesia Plan: General   Post-op Pain Management:    Induction: Intravenous  Airway Management Planned: Oral ETT and Video Laryngoscope Planned  Additional Equipment:   Intra-op Plan:   Post-operative Plan: Extubation in OR  Informed Consent: I have reviewed the patients History and Physical, chart, labs and discussed the procedure including the risks, benefits and alternatives for the proposed anesthesia with the patient or authorized representative who has indicated his/her understanding and acceptance.     Plan Discussed with:   Anesthesia Plan Comments:         Anesthesia Quick Evaluation

## 2013-05-03 NOTE — Anesthesia Procedure Notes (Signed)
Procedure Name: Intubation Date/Time: 05/03/2013 10:17 AM Performed by: Carolyne Littles, Lakeva Hollon L Pre-anesthesia Checklist: Patient identified, Patient being monitored, Timeout performed, Emergency Drugs available and Suction available Patient Re-evaluated:Patient Re-evaluated prior to inductionOxygen Delivery Method: Circle System Utilized Preoxygenation: Pre-oxygenation with 100% oxygen Intubation Type: IV induction Ventilation: Mask ventilation without difficulty Laryngoscope Size: 3 and Miller Grade View: Grade I Tube type: Oral Tube size: 7.0 mm Number of attempts: 1 Airway Equipment and Method: stylet Placement Confirmation: ETT inserted through vocal cords under direct vision,  positive ETCO2 and breath sounds checked- equal and bilateral Secured at: 21 cm Tube secured with: Tape Dental Injury: Teeth and Oropharynx as per pre-operative assessment

## 2013-05-03 NOTE — Preoperative (Signed)
Beta Blockers   Reason not to administer Beta Blockers:Not Applicable 

## 2013-05-03 NOTE — Transfer of Care (Signed)
Immediate Anesthesia Transfer of Care Note  Patient: Madison Arias  Procedure(s) Performed: Procedure(s): REPAIR QUADRICEP TENDON (Right)  Patient Location: PACU  Anesthesia Type:General  Level of Consciousness: awake, oriented and patient cooperative  Airway & Oxygen Therapy: Patient Spontanous Breathing and Patient connected to face mask oxygen  Post-op Assessment: Report given to PACU RN and Post -op Vital signs reviewed and stable  Post vital signs: Reviewed and stable  Complications: No apparent anesthesia complications

## 2013-05-03 NOTE — Interval H&P Note (Signed)
History and Physical Interval Note:  05/03/2013 10:05 AM  Madison Arias  has presented today for surgery, with the diagnosis of quadriceps tear right knee  The various methods of treatment have been discussed with the patient and family. After consideration of risks, benefits and other options for treatment, the patient has consented to  Procedure(s) with comments: REPAIR QUADRICEP TENDON (Right) - 10 am if possible  as a surgical intervention .  The patient's history has been reviewed, patient examined, no change in status, stable for surgery.  I have reviewed the patient's chart and labs.  Questions were answered to the patient's satisfaction.     Fuller Canada

## 2013-05-03 NOTE — Anesthesia Postprocedure Evaluation (Signed)
  Anesthesia Post-op Note  Patient: Madison Arias  Procedure(s) Performed: Procedure(s): REPAIR QUADRICEP TENDON (Right)  Patient Location: PACU  Anesthesia Type:General  Level of Consciousness: awake, oriented and patient cooperative  Airway and Oxygen Therapy: Patient Spontanous Breathing and Patient connected to face mask oxygen  Post-op Pain: mild  Post-op Assessment: Post-op Vital signs reviewed, Patient's Cardiovascular Status Stable, Respiratory Function Stable, Patent Airway, No signs of Nausea or vomiting and Pain level controlled  Post-op Vital Signs: Reviewed and stable  Complications: No apparent anesthesia complications

## 2013-05-03 NOTE — Brief Op Note (Signed)
05/03/2013  11:27 AM  PATIENT:  Madison Arias  62 y.o. female  PRE-OPERATIVE DIAGNOSIS:  quadriceps tear right knee  POST-OPERATIVE DIAGNOSIS:  quadriceps tear right knee  Operative findings partial rupture right quadriceps including the anterior three quarters of the tendon.   PROCEDURE:  Procedure(s): REPAIR QUADRICEP TENDON (Right)  SURGEON:  Surgeon(s) and Role:    * Vickki Hearing, MD - Primary  PHYSICIAN ASSISTANT:   ASSISTANTS:  [place]   ANESTHESIA:   general  EBL:  Total I/O In: 1000 [I.V.:1000] Out: 50 [Blood:50]  BLOOD ADMINISTERED:none  DRAINS: none   LOCAL MEDICATIONS USED:  OTHER Marcaine with epinephrine 60 cc 30 cc in the joint and 30 cc in the subcutaneous tissue  SPECIMEN:  No Specimen  DISPOSITION OF SPECIMEN:  N/A  COUNTS:  YES  TOURNIQUET:   no tourniquet  DICTATION: .Dragon Dictation  PLAN OF CARE: Discharge to home after PACU  PATIENT DISPOSITION:  PACU - hemodynamically stable.   Delay start of Pharmacological VTE agent (>24hrs) due to surgical blood loss or risk of bleeding: Not applicable

## 2013-05-03 NOTE — Op Note (Signed)
Operative report preoperative diagnosis was ruptured quadriceps tendon right knee  Brief op note recorded in brief op note section  In the preop holding area the right knee was marked as a surgical site after confirmation. The chart was reviewed and updated.  The patient was taken to the operating room for general anesthesia. After successful general anesthesia the right leg was prepped from the groin through and including the foot. Sterile draping was performed and timeout was completed.  A midline incision was made. This was taken down to the extensor mechanism. The tear was identified hematoma was evacuated. The wound was irrigated including the joint. The torn portions of the distal quadriceps was excised. The patella was roughened and debrided. 3 drill holes were placed and the patella . 2 #5 sutures were placed in a Krackow suturing technique and then passed through the drill holes in the patella and tied on the distal portion of the patella with the leg in maximum extension.  The knee was flexed and the suture line had tension at 40. This will allow 0-30 range of motion and a hinged brace starting on postop day 2 or once she goes to the office .  30 cc of Marcaine with epinephrine was injected into the joint.   0 Monocryl was placed in subcutaneous tissue staples were used to close the skin 30 cc of Marcaine with epinephrine was injected into the subcutaneous tissue  Sterile dressing was applied and the knee was placed in extension brace  Extubation was performed and the patient was taken recovery in stable condition

## 2013-05-05 ENCOUNTER — Encounter (HOSPITAL_COMMUNITY): Payer: Self-pay | Admitting: Orthopedic Surgery

## 2013-05-06 ENCOUNTER — Encounter: Payer: Self-pay | Admitting: Orthopedic Surgery

## 2013-05-06 ENCOUNTER — Ambulatory Visit (INDEPENDENT_AMBULATORY_CARE_PROVIDER_SITE_OTHER): Payer: Self-pay | Admitting: Orthopedic Surgery

## 2013-05-06 VITALS — BP 154/78 | Ht 68.0 in | Wt 205.0 lb

## 2013-05-06 DIAGNOSIS — S76111A Strain of right quadriceps muscle, fascia and tendon, initial encounter: Secondary | ICD-10-CM

## 2013-05-06 DIAGNOSIS — IMO0002 Reserved for concepts with insufficient information to code with codable children: Secondary | ICD-10-CM

## 2013-05-06 MED ORDER — HYDROCODONE-ACETAMINOPHEN 10-325 MG PO TABS: 1.0000 | ORAL_TABLET | ORAL | Status: DC | PRN

## 2013-05-06 MED ORDER — IBUPROFEN 800 MG PO TABS: 800.0000 mg | ORAL_TABLET | Freq: Three times a day (TID) | ORAL | Status: AC | PRN

## 2013-05-06 MED ORDER — PROMETHAZINE HCL 25 MG PO TABS: 25.0000 mg | ORAL_TABLET | Freq: Four times a day (QID) | ORAL | Status: AC | PRN

## 2013-05-06 NOTE — Progress Notes (Signed)
Patient ID: Madison Arias, female   DOB: 01/26/1951, 62 y.o.   MRN: [id number]  Chief Complaint  Patient presents with  . Follow-up    Post op 1 Right quadricept tendon repair     05/03/2013  11:27 AM  PATIENT:  Madison Arias  62 y.o. female  PRE-OPERATIVE DIAGNOSIS:  quadriceps tear right knee  POST-OPERATIVE DIAGNOSIS:  quadriceps tear right knee  Operative findings partial rupture right quadriceps including the anterior three quarters of the tendon.   PROCEDURE:  Procedure(s): REPAIR QUADRICEP TENDON (Right)  The knee was flexed and the suture line had tension at 40. This will allow 0-30 range of motion and a hinged brace starting on postop day 2 or once she goes to the office .  Dressing change wound looks clean the patient complains of severe pain despite being on Percocet. She is getting the euphoric effect but no pain relief  Recommend change medication  Knee immobilizer keep knee straight weightbearing as tolerated followup on the 30th for staple removal and application of brace 0-30   Meds ordered this encounter  Medications  . HYDROcodone-acetaminophen (NORCO) 10-325 MG per tablet    Sig: Take 1 tablet by mouth every 4 (four) hours as needed for pain.    Dispense:  84 tablet    Refill:  0  . promethazine (PHENERGAN) 25 MG tablet    Sig: Take 1 tablet (25 mg total) by mouth every 6 (six) hours as needed for nausea.    Dispense:  90 tablet    Refill:  1  . ibuprofen (ADVIL,MOTRIN) 800 MG tablet    Sig: Take 1 tablet (800 mg total) by mouth every 8 (eight) hours as needed for pain.    Dispense:  90 tablet    Refill:  5

## 2013-05-18 ENCOUNTER — Ambulatory Visit (INDEPENDENT_AMBULATORY_CARE_PROVIDER_SITE_OTHER): Payer: Self-pay | Admitting: Orthopedic Surgery

## 2013-05-18 ENCOUNTER — Encounter: Payer: Self-pay | Admitting: Orthopedic Surgery

## 2013-05-18 VITALS — BP 147/69 | Ht 68.0 in | Wt 205.0 lb

## 2013-05-18 DIAGNOSIS — IMO0002 Reserved for concepts with insufficient information to code with codable children: Secondary | ICD-10-CM

## 2013-05-18 DIAGNOSIS — S76111A Strain of right quadriceps muscle, fascia and tendon, initial encounter: Secondary | ICD-10-CM

## 2013-05-18 MED ORDER — HYDROCODONE-ACETAMINOPHEN 10-325 MG PO TABS: 1.0000 | ORAL_TABLET | ORAL | Status: DC | PRN

## 2013-05-18 NOTE — Patient Instructions (Addendum)
Full weight bearing   Brace at all times 0-30

## 2013-05-18 NOTE — Progress Notes (Signed)
Patient ID: Madison Arias, female   DOB: [birth year], 62 y.o.   MRN: [id number]  Chief Complaint  Patient presents with  . Follow-up    Post op recheck on right quad repair. DOS 05-03-13. Staples out today.    Postop visit #2 status post quadriceps tendon repair right lower right leg, postop day #15  Wound healed nicely staples taken out patient placed in 0-30 range of motion brace medication refilled return in 2 weeks advance brace another 30, work on active range of motion 0-30 full weightbearing keep brace on all the time

## 2013-05-20 ENCOUNTER — Ambulatory Visit: Payer: BC Managed Care – PPO | Admitting: Orthopedic Surgery

## 2013-06-01 ENCOUNTER — Ambulatory Visit (INDEPENDENT_AMBULATORY_CARE_PROVIDER_SITE_OTHER): Payer: Self-pay | Admitting: Orthopedic Surgery

## 2013-06-01 ENCOUNTER — Encounter: Payer: Self-pay | Admitting: Orthopedic Surgery

## 2013-06-01 VITALS — BP 167/80 | Ht 68.0 in | Wt 205.0 lb

## 2013-06-01 DIAGNOSIS — S76111D Strain of right quadriceps muscle, fascia and tendon, subsequent encounter: Secondary | ICD-10-CM

## 2013-06-01 DIAGNOSIS — Z5189 Encounter for other specified aftercare: Secondary | ICD-10-CM

## 2013-06-01 NOTE — Progress Notes (Signed)
Patient ID: Madison Arias, female   DOB: 10-Sep-1950, 62 y.o.   MRN: [id number] Chief Complaint  Patient presents with  . Follow-up    post op  quad repair DOS 05/03/13    Active rom is 30-0   Adv rom to 60   Return 2 weeks   Schedule therapy then

## 2013-06-16 ENCOUNTER — Ambulatory Visit (INDEPENDENT_AMBULATORY_CARE_PROVIDER_SITE_OTHER): Payer: Self-pay | Admitting: Orthopedic Surgery

## 2013-06-16 VITALS — BP 151/66 | Ht 68.0 in | Wt 205.0 lb

## 2013-06-16 DIAGNOSIS — Z5189 Encounter for other specified aftercare: Secondary | ICD-10-CM

## 2013-06-16 DIAGNOSIS — S76111D Strain of right quadriceps muscle, fascia and tendon, subsequent encounter: Secondary | ICD-10-CM

## 2013-06-16 DIAGNOSIS — S76111A Strain of right quadriceps muscle, fascia and tendon, initial encounter: Secondary | ICD-10-CM

## 2013-06-16 DIAGNOSIS — IMO0002 Reserved for concepts with insufficient information to code with codable children: Secondary | ICD-10-CM

## 2013-06-16 MED ORDER — HYDROCODONE-ACETAMINOPHEN 10-325 MG PO TABS: 1.0000 | ORAL_TABLET | ORAL | Status: DC | PRN

## 2013-06-16 NOTE — Progress Notes (Signed)
Patient ID: Madison Arias, female   DOB: 17-Sep-1950, 62 y.o.   MRN: [id number]  Chief Complaint  Patient presents with  . Follow-up    2 week recheck post op right qaud repair DOS 05/03/13    Postop week #6 status post repair right quadriceps tendon on September 15. She is in a hinged brace doing well  She has active extension from 30 to 0  Her pain is down her swelling is minimal  We would like to start physical therapy continue hinged knee brace full range of motion lock for tension and walking  Return in 5 weeks  At that time we will assess her return to work situation number currently she has a return to work date expected of December 10

## 2013-06-16 NOTE — Patient Instructions (Signed)
Call to arrange therapy 

## 2013-07-05 ENCOUNTER — Other Ambulatory Visit: Payer: Self-pay | Admitting: *Deleted

## 2013-07-05 ENCOUNTER — Telehealth: Payer: Self-pay | Admitting: Orthopedic Surgery

## 2013-07-05 DIAGNOSIS — S76111D Strain of right quadriceps muscle, fascia and tendon, subsequent encounter: Secondary | ICD-10-CM

## 2013-07-05 NOTE — Telephone Encounter (Signed)
Madison Arias said the therapist said she needs to move from the walker to a 1 point cane.  Asked if an order for the cane can be faxed to [facility]  Call her cell [phone]

## 2013-07-05 NOTE — Telephone Encounter (Signed)
Sent in prescription for one point cane and gave verbal over the phone to [employer].

## 2013-07-22 ENCOUNTER — Encounter: Payer: Self-pay | Admitting: Orthopedic Surgery

## 2013-07-22 ENCOUNTER — Ambulatory Visit (INDEPENDENT_AMBULATORY_CARE_PROVIDER_SITE_OTHER): Payer: BC Managed Care – PPO | Admitting: Orthopedic Surgery

## 2013-07-22 VITALS — BP 182/84 | Ht 68.0 in | Wt 205.0 lb

## 2013-07-22 DIAGNOSIS — Z5189 Encounter for other specified aftercare: Secondary | ICD-10-CM

## 2013-07-22 DIAGNOSIS — S76111D Strain of right quadriceps muscle, fascia and tendon, subsequent encounter: Secondary | ICD-10-CM

## 2013-07-22 NOTE — Patient Instructions (Addendum)
Return to work 08/02/13 Wear new brace x 3 mos  Finish therapy

## 2013-07-23 ENCOUNTER — Telehealth: Payer: Self-pay | Admitting: Orthopedic Surgery

## 2013-07-23 NOTE — Telephone Encounter (Signed)
Call received from [facility] medical equipment,  Ph# [phone]/Fax# [phone], regarding order of 07/22/13 for "Madison Arias hinged brace, open hinge."   Patient had taken this prescription there, and per Noreene Larsson, no "[employer]" product available, although states they do have a similar product by another company.  Please advise as to whether patient is to have this item ordered through our [employer], or may patient receive the in-stock similar product through [facility]? -see ph# above.  Patient aware of status. Her ph# is [phone]

## 2013-07-25 NOTE — Progress Notes (Signed)
Subjective:     Patient ID: Madison Arias, female   DOB: [birth year], 62 y.o.   MRN: [id number] Chief Complaint  Patient presents with  . Follow-up    5 week recheck Right quad repair DOS 05/03/13   No diagnosis found. BP 182/84  Ht 5\' 8"  (1.727 m)  Wt 205 lb (92.987 kg)  BMI 31.18 kg/m2  HPI RT QUAD REPAIR DOING WELL 12 WKS POST OP   Review of Systems     Objective:   Physical Exam INTACT SLR WITH MILD QUAD WEAKNESS     Assessment:     POST OP QUAD REPAIR RT KNEE      Plan:     RET TO WORK  THERAPY  HINGE KNEE BRACE X 3 MONTHS  RETURN IN 3 MONTHS

## 2013-07-26 NOTE — Telephone Encounter (Signed)
Advised pharmacy that it could be any brand doesn't have to be Irena Cords, as Ghalia Reicks as it was a wrap on Economy hinge brace. Advised that we didn't have her size in stock, and that is why Dr. Romeo Apple wrote her the script for it.

## 2013-10-21 ENCOUNTER — Ambulatory Visit (INDEPENDENT_AMBULATORY_CARE_PROVIDER_SITE_OTHER): Payer: BC Managed Care – PPO | Admitting: Orthopedic Surgery

## 2013-10-21 ENCOUNTER — Encounter: Payer: Self-pay | Admitting: Orthopedic Surgery

## 2013-10-21 VITALS — BP 171/77 | Ht 68.0 in | Wt 205.0 lb

## 2013-10-21 DIAGNOSIS — IMO0002 Reserved for concepts with insufficient information to code with codable children: Secondary | ICD-10-CM

## 2013-10-21 DIAGNOSIS — S76119A Strain of unspecified quadriceps muscle, fascia and tendon, initial encounter: Secondary | ICD-10-CM

## 2013-10-21 DIAGNOSIS — Z9889 Other specified postprocedural states: Secondary | ICD-10-CM | POA: Insufficient documentation

## 2013-10-21 MED ORDER — HYDROCODONE-ACETAMINOPHEN 7.5-325 MG PO TABS: 1.0000 | ORAL_TABLET | Freq: Four times a day (QID) | ORAL | Status: AC | PRN

## 2013-10-21 NOTE — Patient Instructions (Signed)
Renew handicap sticker Increase bike intensity one at a time

## 2013-10-21 NOTE — Progress Notes (Signed)
Patient ID: Madison Arias, female   DOB: 08/19/1950, 63 y.o.   MRN: [id number]  Encounter Diagnoses  Name Primary?  . Quadriceps muscle rupture Yes  . S/P knee surgery     Chief Complaint  Patient presents with  . Follow-up    3 month recheck, 6 mo s/p right quad repair DOS 05/03/13   BP 171/77  Ht 5\' 8"  (1.727 m)  Wt 205 lb (92.987 kg)  BMI 31.18 kg/m2  The patient is doing well slight weakness. Notices her knee doing better and better daily has some difficulty going out more than 7 stairs at a time. Also reports some dull tolerable pain usually handled by ibuprofen occasionally by Norco. She would like something less strong for pain  Her vital signs are stable General appearance is normal, the patient is alert and oriented x3 with normal mood and affect.  her ambulation supported by a cane she has full extension of the knee and 5 strength out of 5 manual muscle testing in terms of extension. No tenderness around the incision no swelling.   Recommend light Economy hinged brace Progressive increase in activity  Followup 6 months

## 2014-03-19 DEATH — deceased

## 2014-05-05 ENCOUNTER — Ambulatory Visit: Payer: BC Managed Care – PPO | Admitting: Orthopedic Surgery

## 2014-05-10 ENCOUNTER — Ambulatory Visit: Payer: BC Managed Care – PPO | Admitting: Orthopedic Surgery

## 2015-03-13 IMAGING — CR DG FEMUR 2+V*R*
5 series · 5 of 5 positions shown · non-contrast
Comparison: None.

CLINICAL DATA: Fall with pain.

RIGHT FEMUR - 2 VIEW

[view not recorded (1 of 5)]
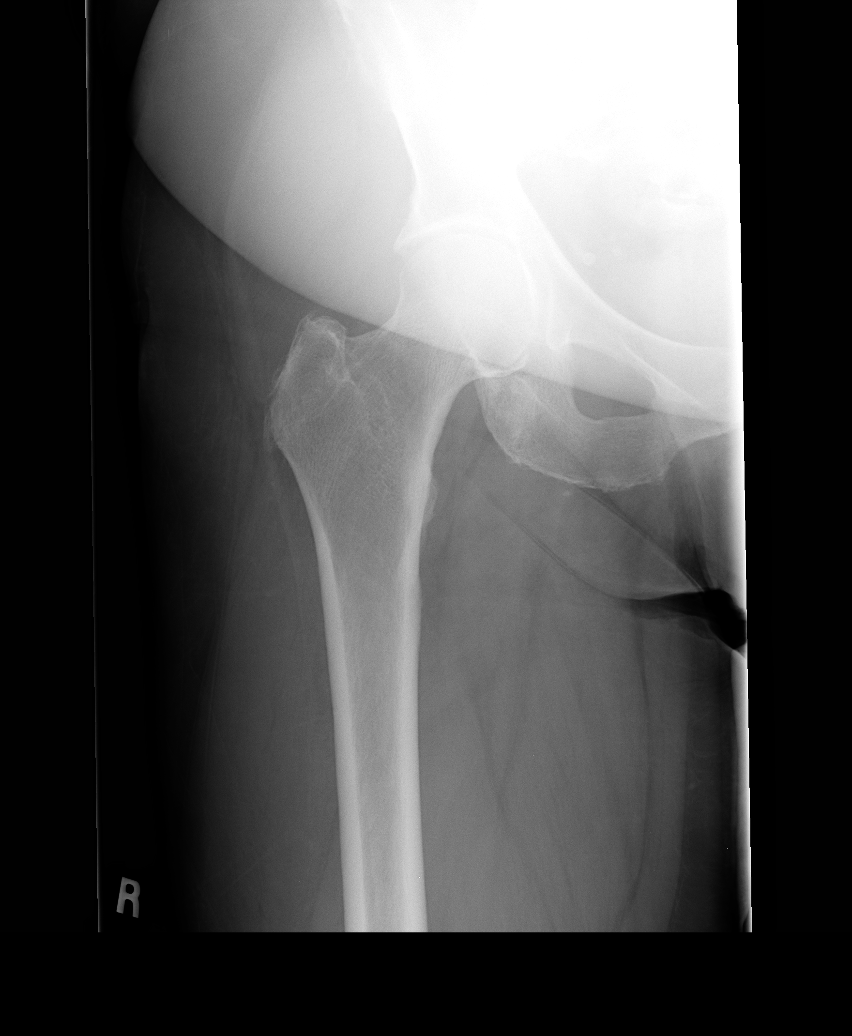

[view not recorded (2 of 5)]
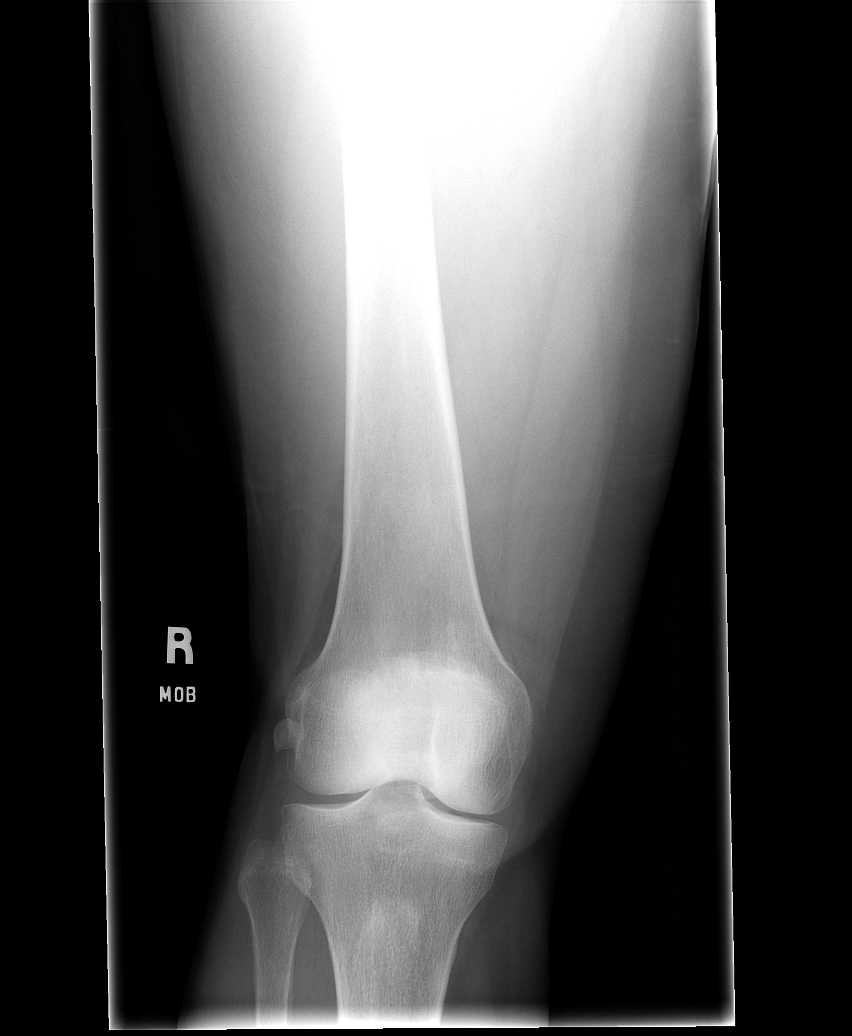

[view not recorded (3 of 5)]
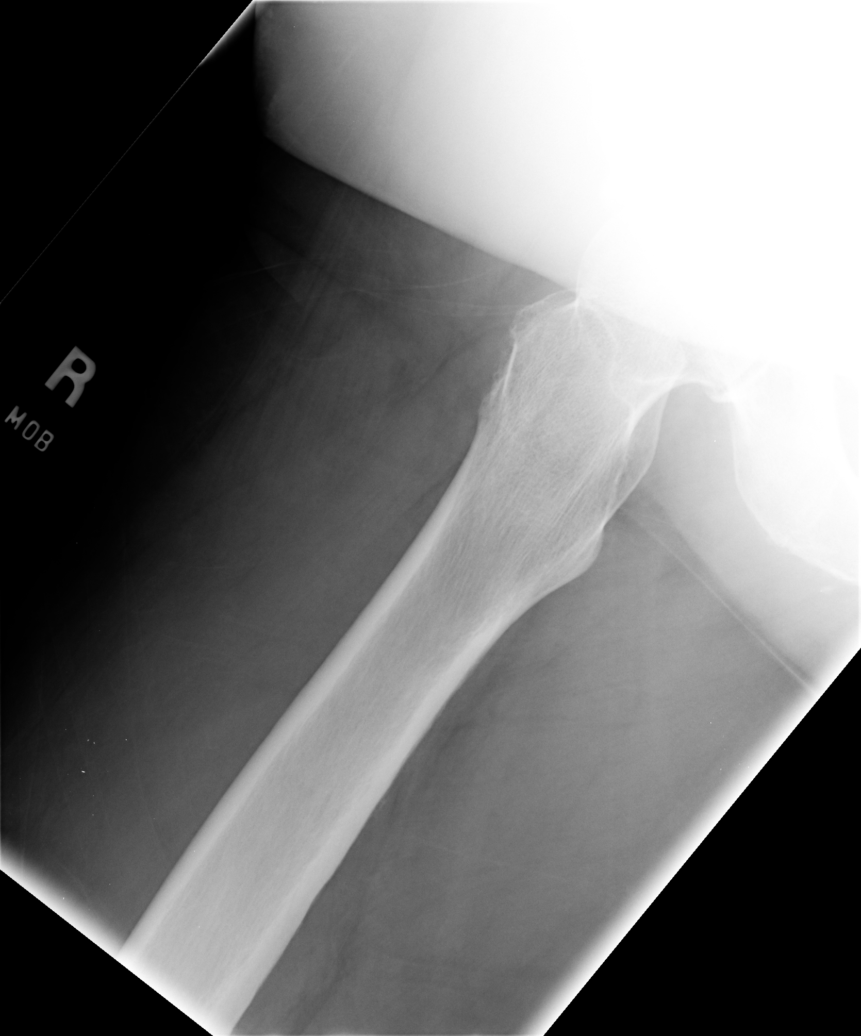

[view not recorded (4 of 5)]
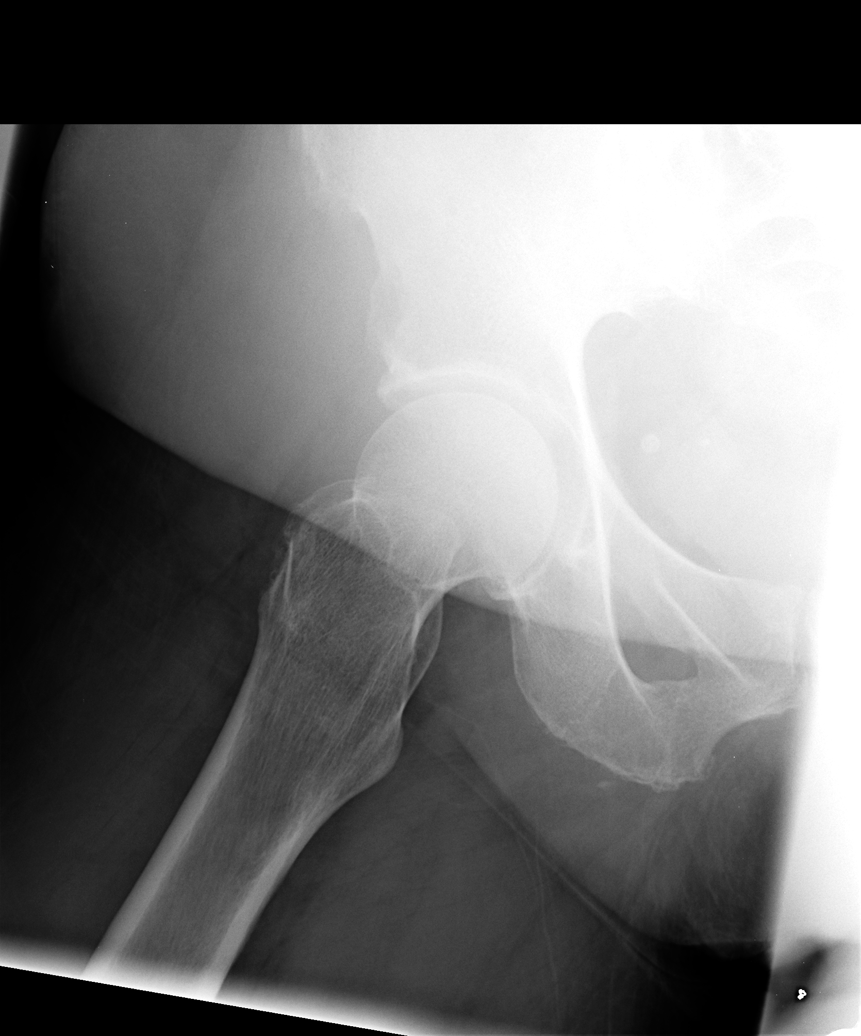

[view not recorded (5 of 5)]
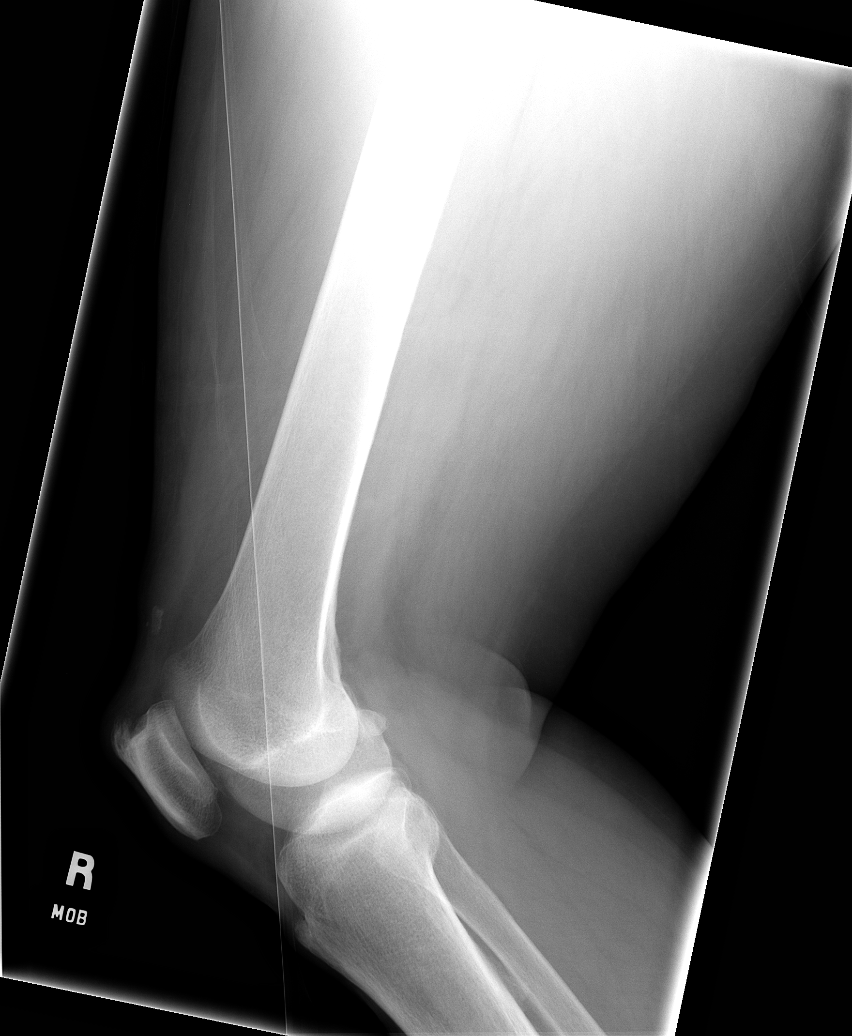

[5 of 5 positions shown; findings below may reference images not displayed]

FINDINGS: Negative for fracture. Mild hip osteoarthritis with
marginal spurring.  Myotendinous junction distal quadriceps
heterotopic ossification.
IMPRESSION: Negative for acute osseous injury.

## 2015-03-13 IMAGING — CT CT CERVICAL SPINE W/O CM
3 of 4 series · 12 of 33 positions shown, 14 images · non-contrast
Comparison: None.

CLINICAL DATA: Fall. Right neck pain.

EXAM:
CT CERVICAL SPINE WITHOUT CONTRAST
TECHNIQUE: Multidetector CT imaging of the cervical spine was performed without
intravenous contrast. Multiplanar CT image reconstructions were also
generated.

[Series 3: cervical st 2.0 b31s · axial · 0.27mm/px · z∈[+25,+137]mm · 4 of 86 slices shown, 5 images]
[im 15/86  soft-tissue]
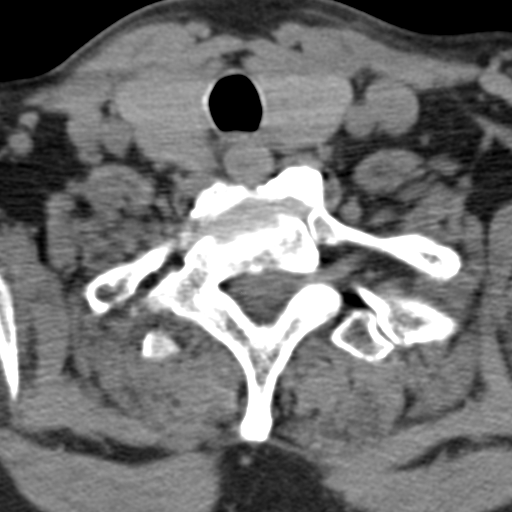
[im 15/86  bone]
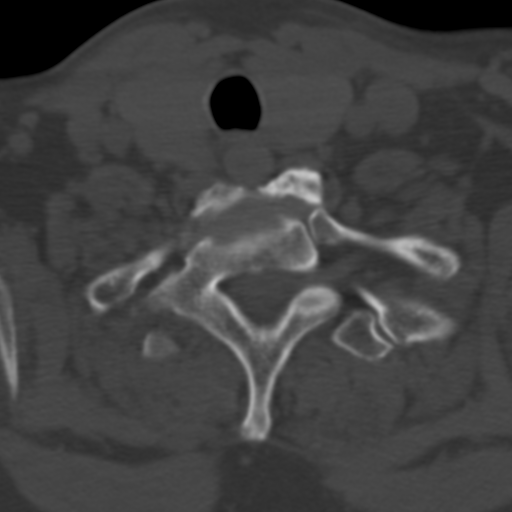
[im 29/86  bone]
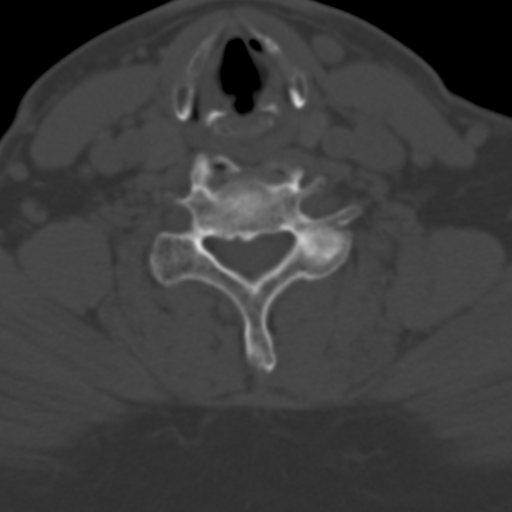
[im 57/86  bone]
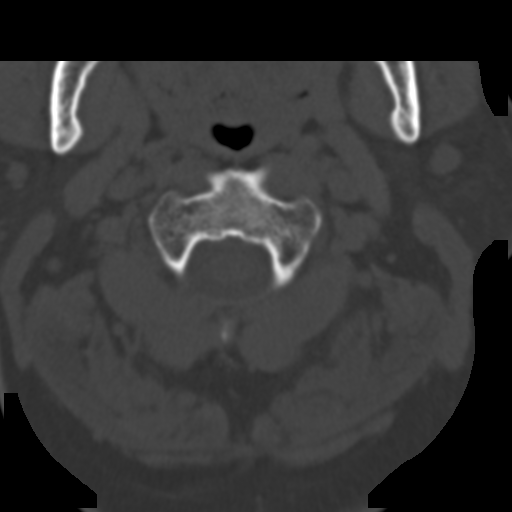
[im 71/86  bone]
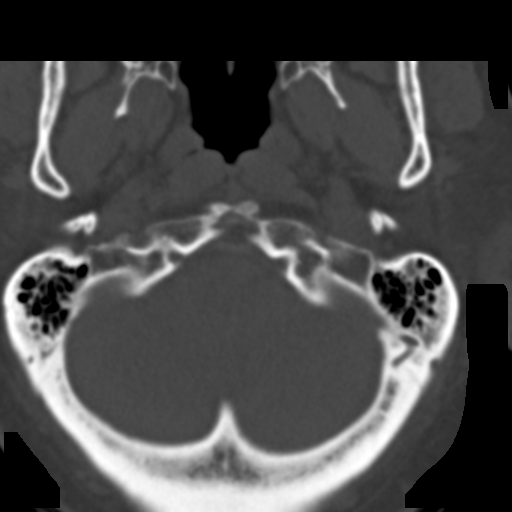

[Series 5: sagittal bone 2.0 · sagittal · 0.22mm/px · 5 of 60 slices shown, 6 images]
[im 20/60  bone]
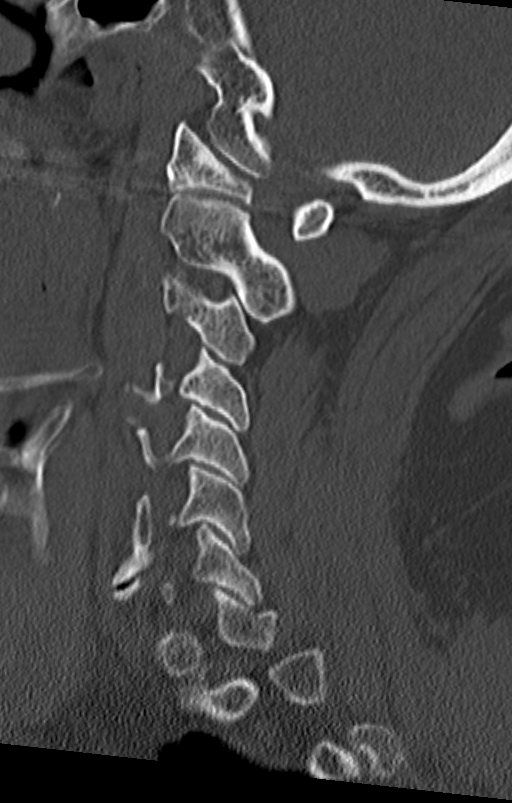
[im 25/60  bone]
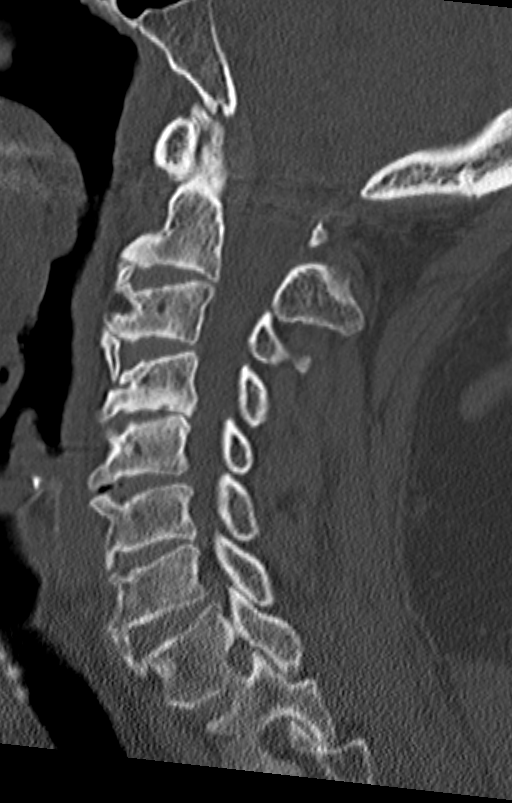
[im 30/60  soft-tissue]
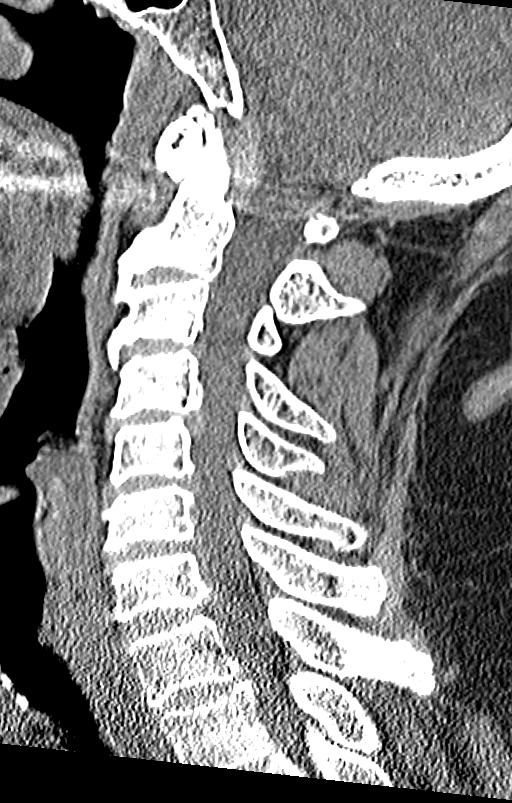
[im 30/60  bone]
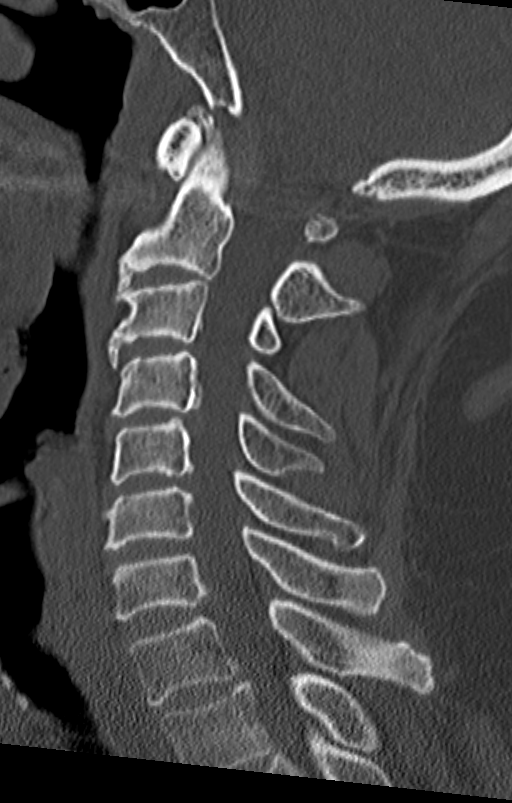
[im 35/60  bone]
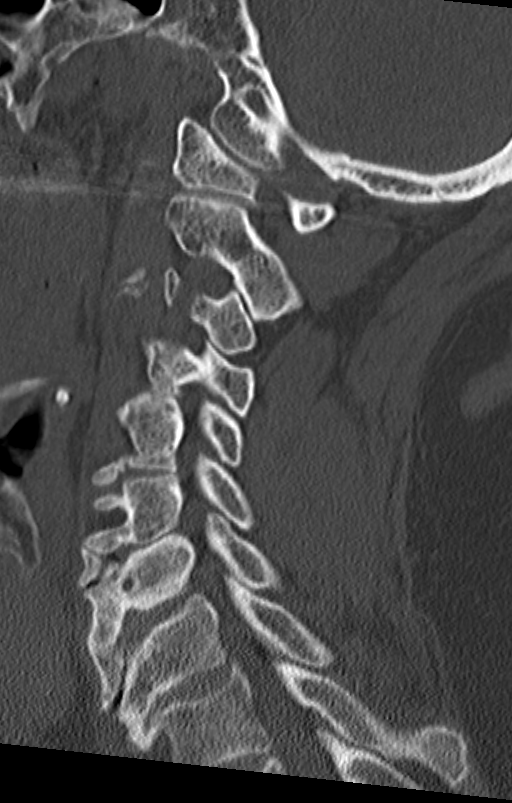
[im 40/60  bone]
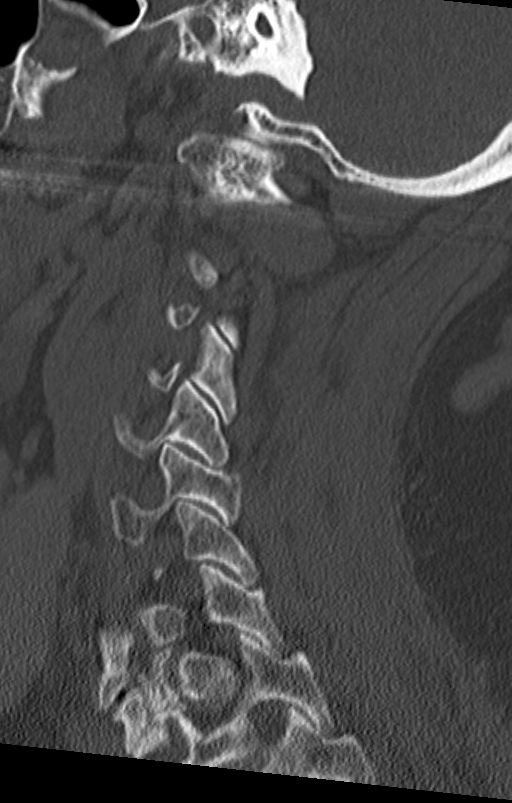

[Series 6: coronal bone 2.0 · coronal · 0.24mm/px · 3 of 52 slices shown]
[im 11/52  bone]
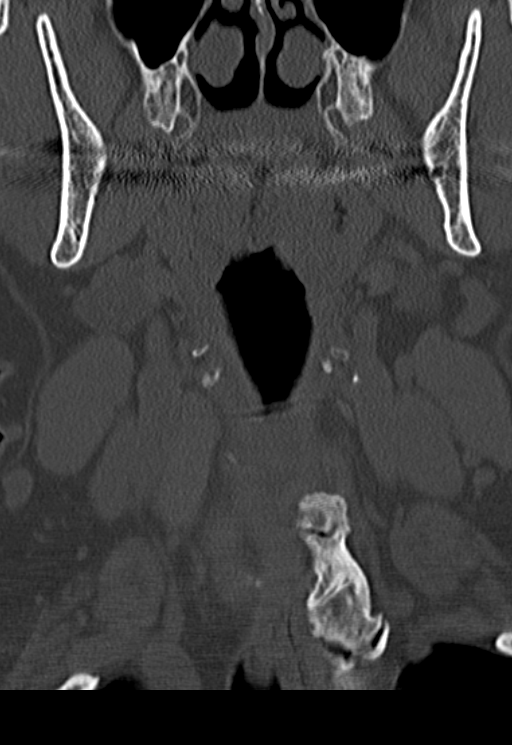
[im 21/52  bone]
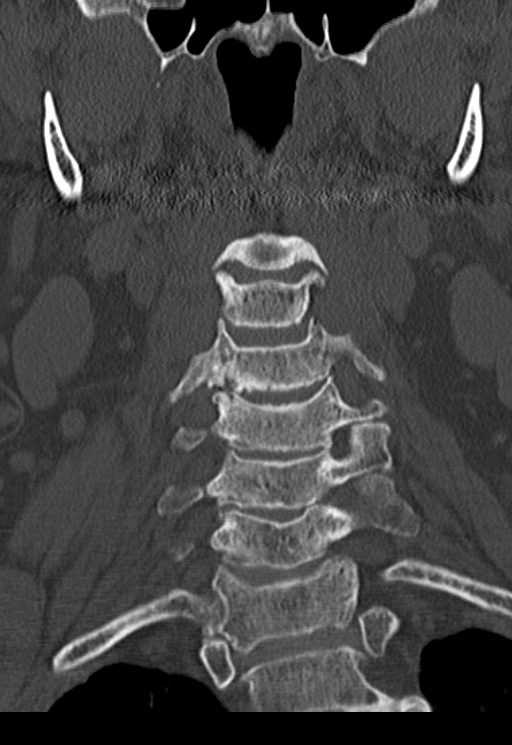
[im 31/52  bone]
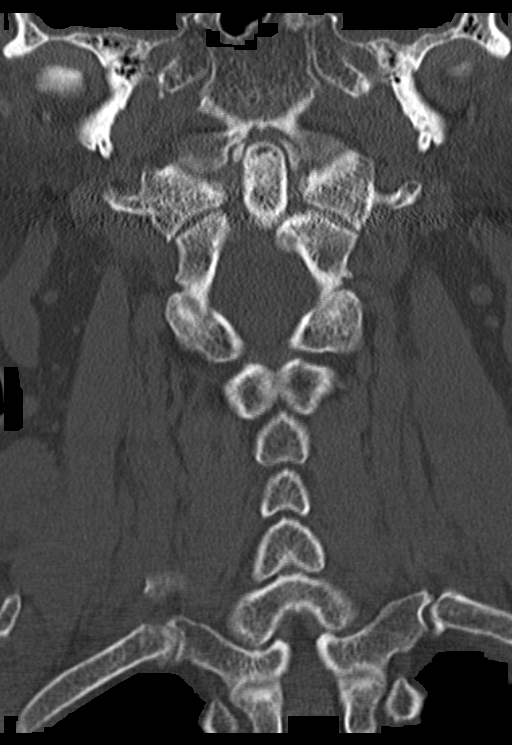

[12 of 33 positions shown; findings below may reference images not displayed]

FINDINGS: Diffuse degenerative disc disease with large anterior and lateral
osteophytes. Normal alignment. Prevertebral soft tissues are normal.
No fracture. No epidural or paraspinal hematoma.
IMPRESSION: Severe degenerative changes. No acute findings.

## 2015-03-13 IMAGING — CR DG THORACIC SPINE 3V
3 series · 3 of 3 positions shown · non-contrast
Comparison: None.

CLINICAL DATA: Fall with pain.

THORACIC SPINE - 2 VIEW + SWIMMERS

[view not recorded (1 of 3)]
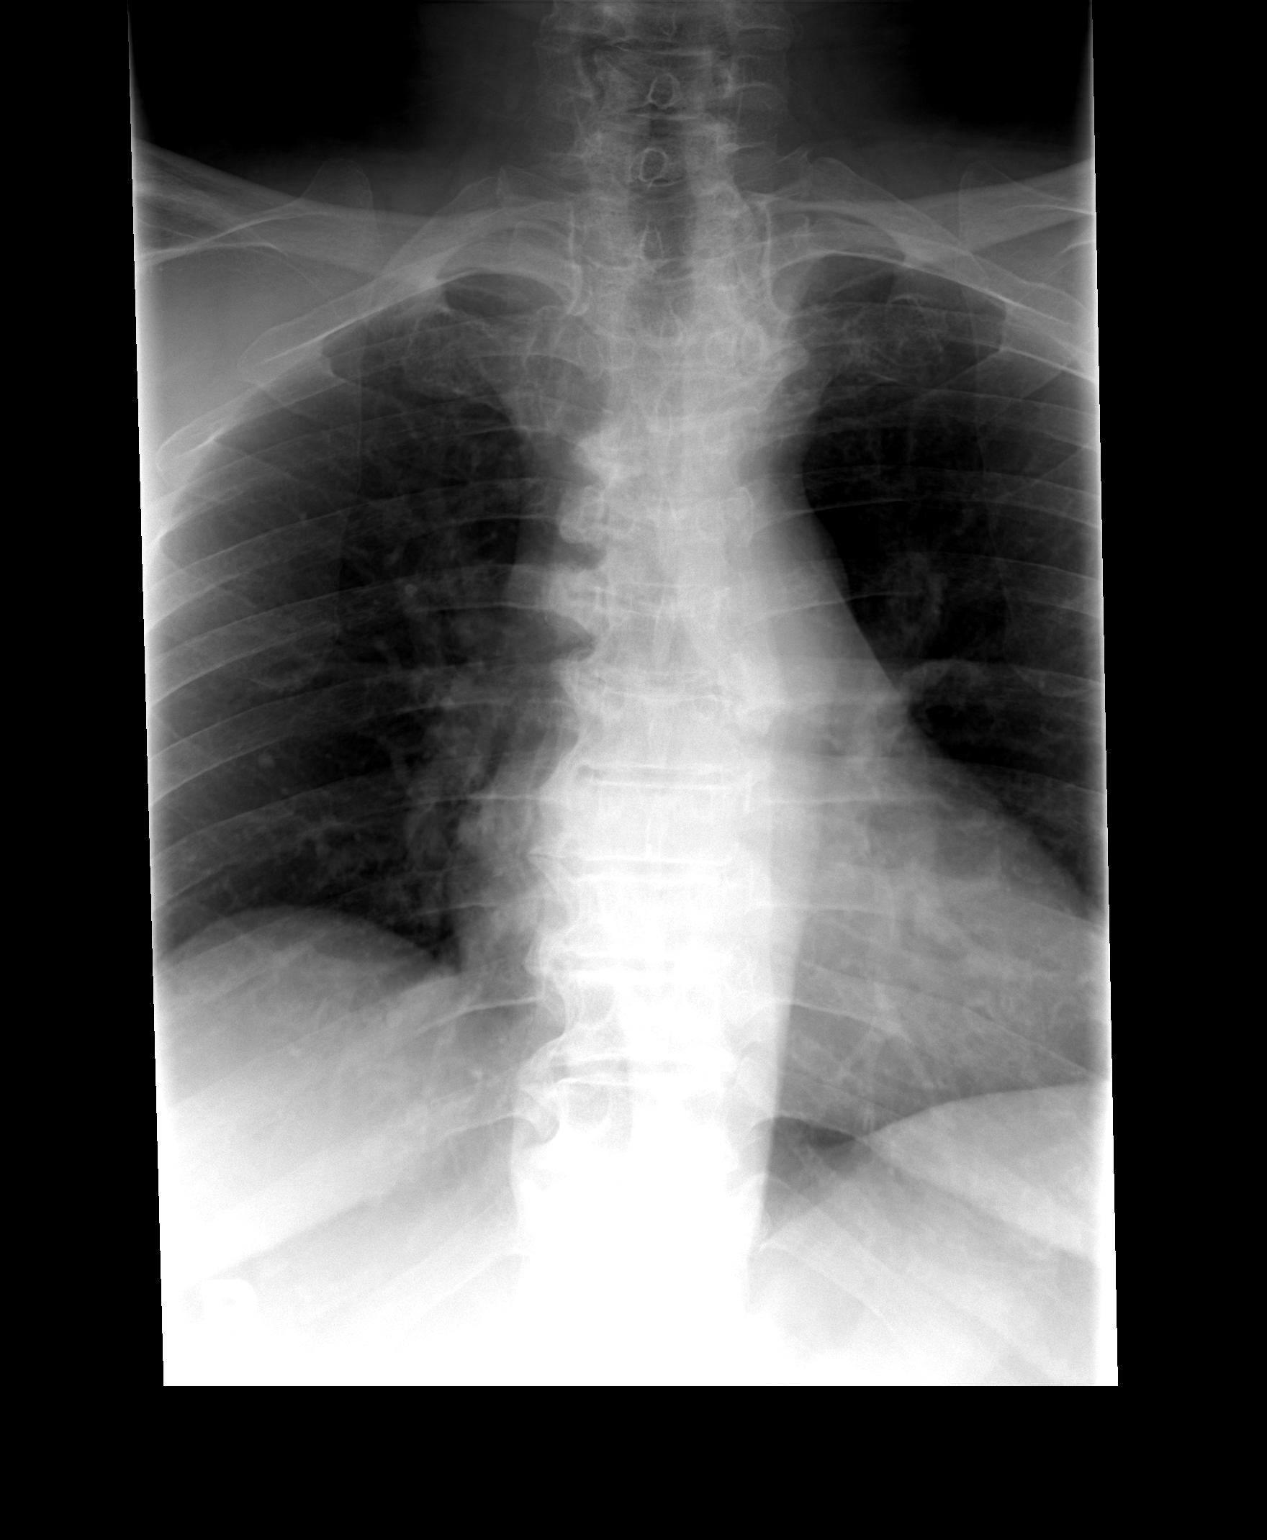

[view not recorded (2 of 3)]
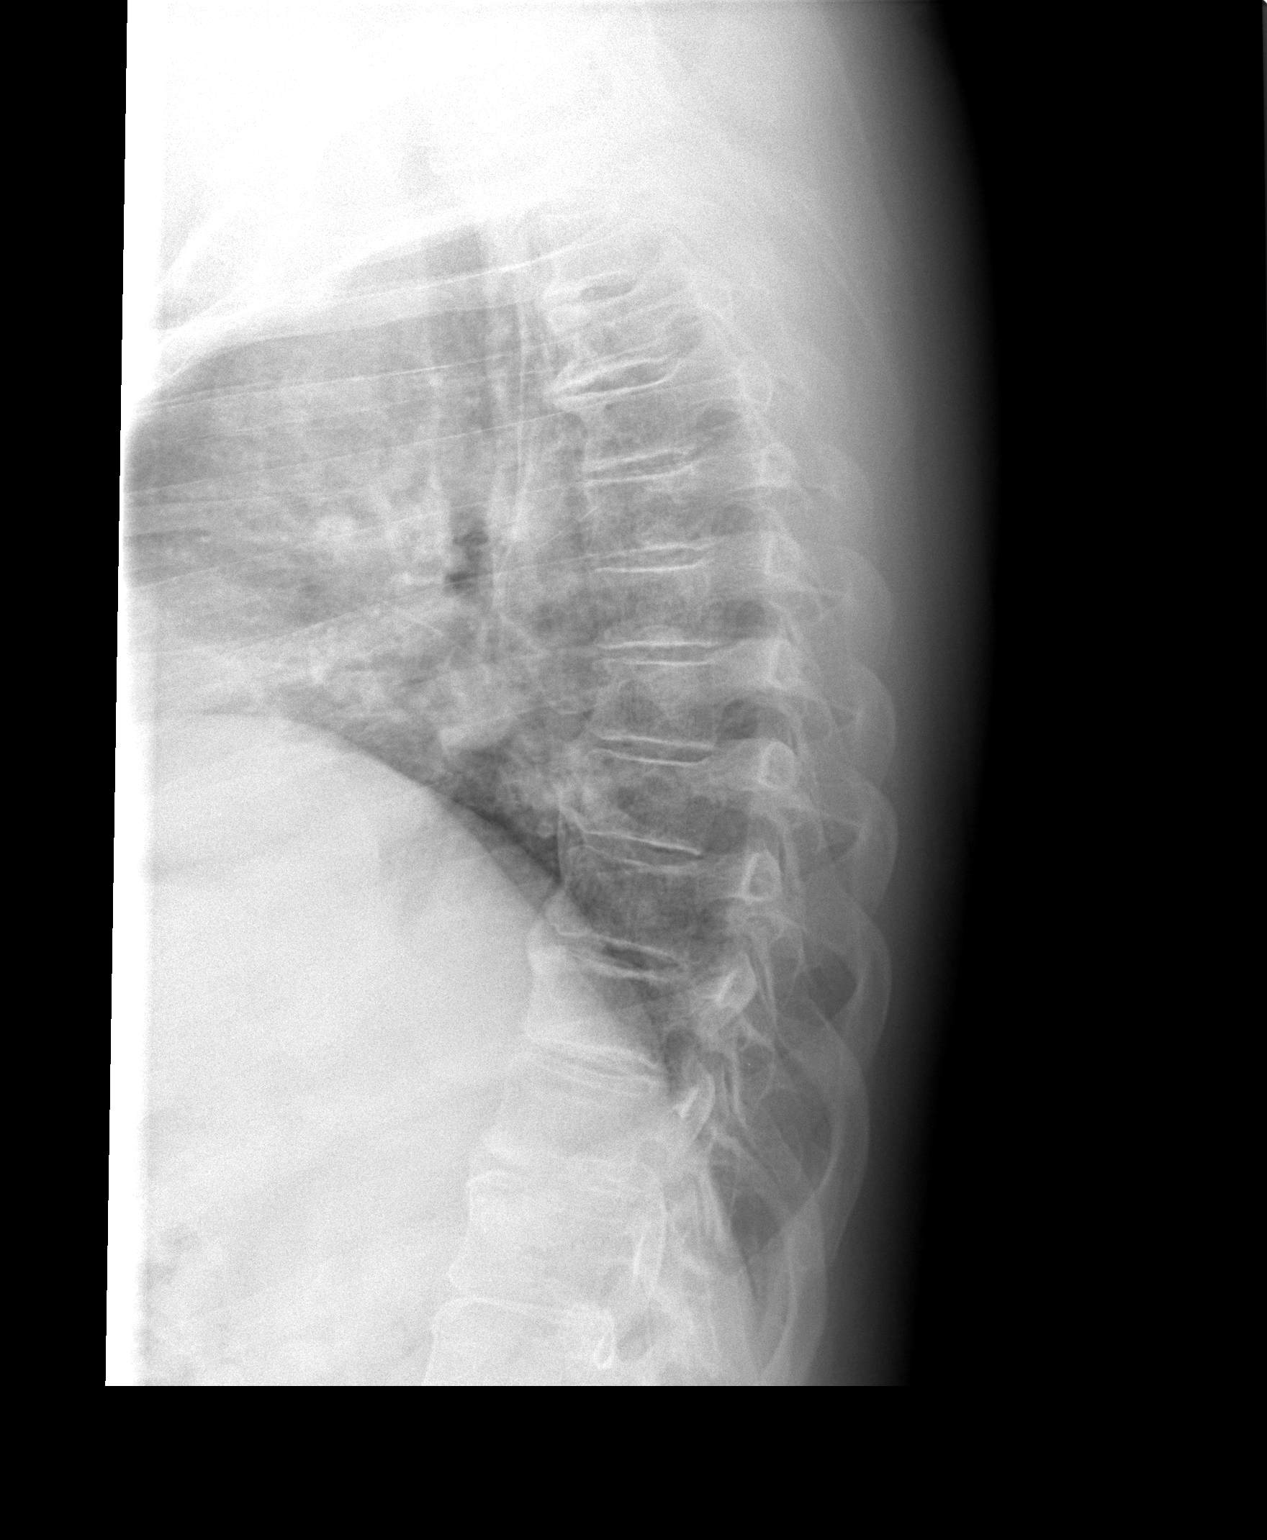

[view not recorded (3 of 3)]
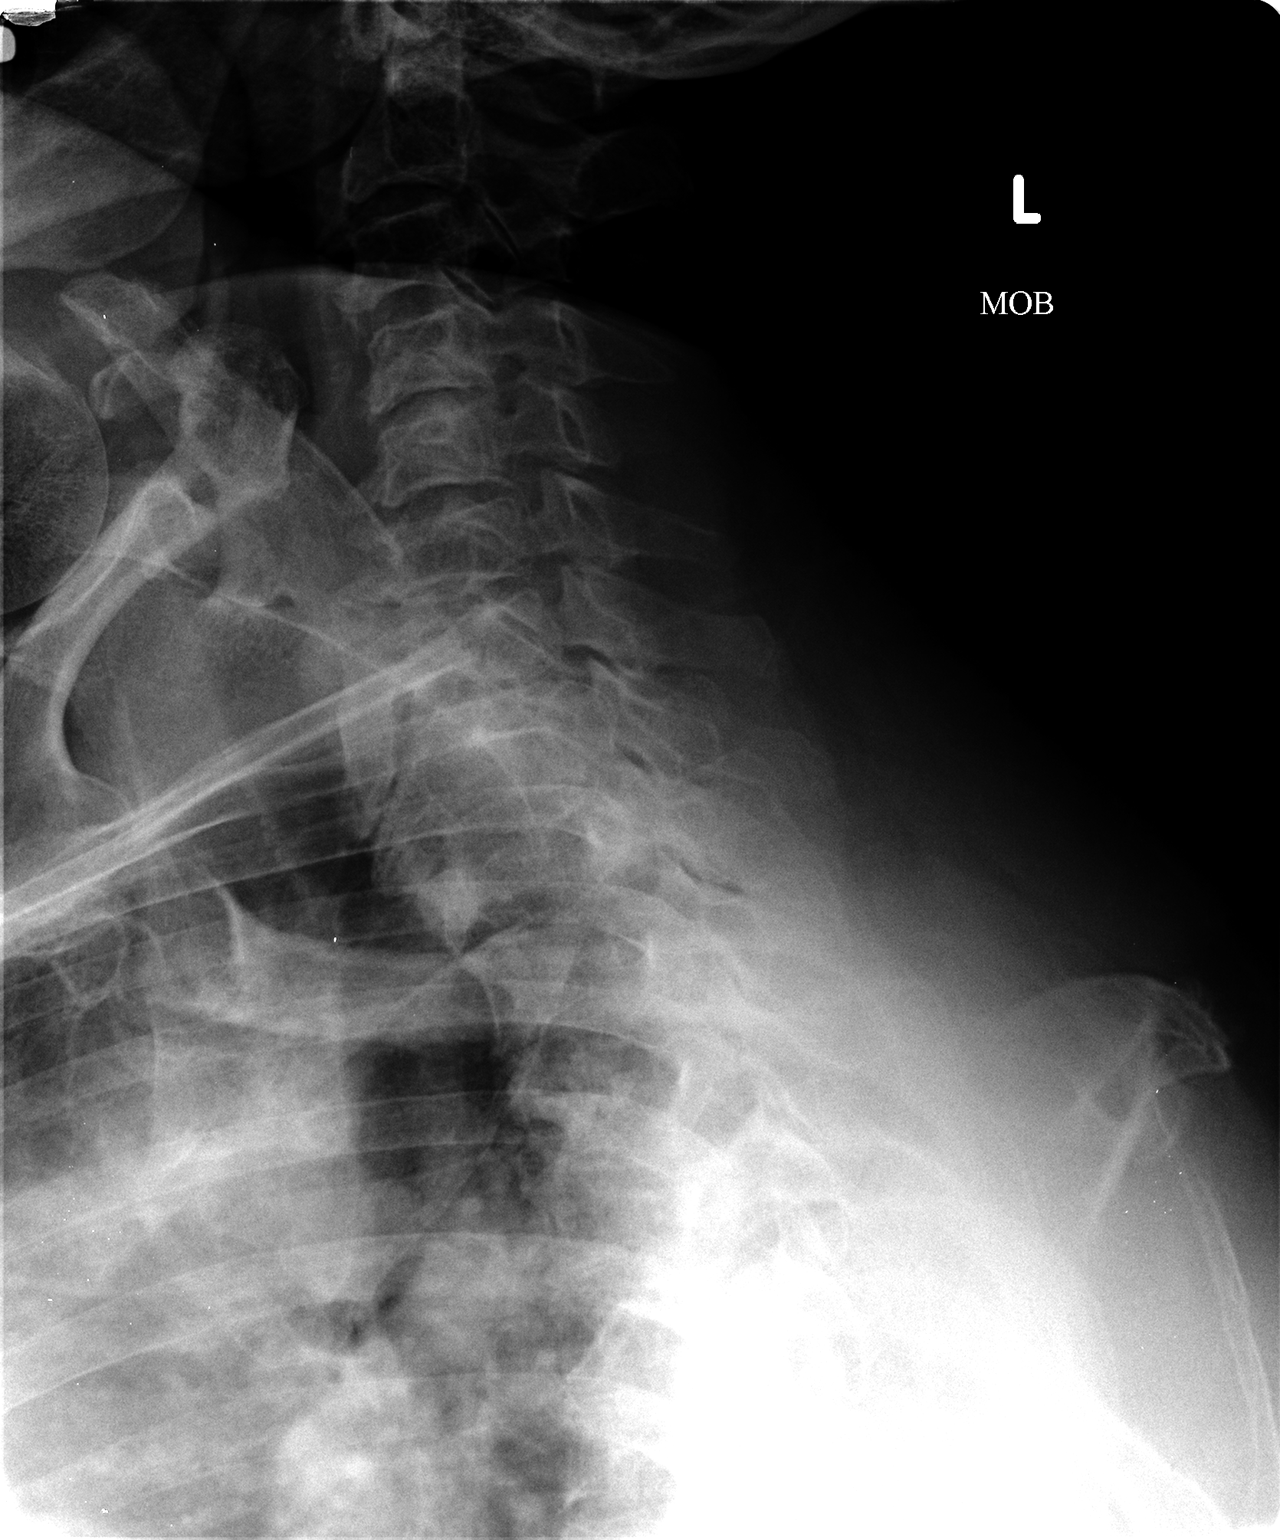

[3 of 3 positions shown; findings below may reference images not displayed]

FINDINGS: {No evidence of acute fracture or subluxation. Diffuse
degenerative endplate spurring.

Numerous bilateral calcified pulmonary nodules, compatible with
remote granulomatous infection.  No evidence of acute injury to the
chest.
IMPRESSION: No evidence of acute thoracic spine injury.
